# Patient Record
Sex: Female | Born: 1985
Health system: Southern US, Community
[De-identification: ages and names within clinical notes are randomized; demographics above are authoritative.]

## PROBLEM LIST (undated history)

## (undated) DIAGNOSIS — J189 Pneumonia, unspecified organism: Secondary | ICD-10-CM

## (undated) DIAGNOSIS — F840 Autistic disorder: Secondary | ICD-10-CM

## (undated) DIAGNOSIS — F4310 Post-traumatic stress disorder, unspecified: Secondary | ICD-10-CM

## (undated) DIAGNOSIS — F845 Asperger's syndrome: Secondary | ICD-10-CM

## (undated) DIAGNOSIS — K2970 Gastritis, unspecified, without bleeding: Secondary | ICD-10-CM

## (undated) DIAGNOSIS — K279 Peptic ulcer, site unspecified, unspecified as acute or chronic, without hemorrhage or perforation: Secondary | ICD-10-CM

## (undated) DIAGNOSIS — F419 Anxiety disorder, unspecified: Secondary | ICD-10-CM

## (undated) DIAGNOSIS — R51 Headache: Secondary | ICD-10-CM

## (undated) HISTORY — DX: Peptic ulcer, site unspecified, unspecified as acute or chronic, without hemorrhage or perforation: K27.9

## (undated) HISTORY — PX: HAND SURGERY: SHX662

## (undated) HISTORY — DX: Gastritis, unspecified, without bleeding: K29.70

## (undated) HISTORY — DX: Headache: R51

---

## 2010-02-12 ENCOUNTER — Emergency Department (HOSPITAL_COMMUNITY): Admission: EM | Admit: 2010-02-12 | Discharge: 2010-02-12 | Payer: Self-pay | Admitting: Emergency Medicine

## 2010-03-22 ENCOUNTER — Emergency Department (HOSPITAL_COMMUNITY)
Admission: EM | Admit: 2010-03-22 | Discharge: 2010-03-22 | Payer: Self-pay | Source: Home / Self Care | Admitting: Family Medicine

## 2010-04-08 ENCOUNTER — Emergency Department (HOSPITAL_COMMUNITY)
Admission: EM | Admit: 2010-04-08 | Discharge: 2010-04-08 | Payer: Self-pay | Source: Home / Self Care | Admitting: Emergency Medicine

## 2010-04-11 LAB — POCT PREGNANCY, URINE: Preg Test, Ur: NEGATIVE

## 2010-05-21 ENCOUNTER — Emergency Department (HOSPITAL_COMMUNITY)
Admission: EM | Admit: 2010-05-21 | Discharge: 2010-05-21 | Disposition: A | Discharge status: Home / Self Care | Payer: Self-pay | Attending: Emergency Medicine | Admitting: Emergency Medicine

## 2010-05-21 DIAGNOSIS — R079 Chest pain, unspecified: Secondary | ICD-10-CM | POA: Insufficient documentation

## 2010-05-21 DIAGNOSIS — R51 Headache: Secondary | ICD-10-CM | POA: Insufficient documentation

## 2010-05-21 DIAGNOSIS — Z79899 Other long term (current) drug therapy: Secondary | ICD-10-CM | POA: Insufficient documentation

## 2010-05-21 DIAGNOSIS — R55 Syncope and collapse: Secondary | ICD-10-CM | POA: Insufficient documentation

## 2010-05-21 DIAGNOSIS — R110 Nausea: Secondary | ICD-10-CM | POA: Insufficient documentation

## 2010-05-21 DIAGNOSIS — R42 Dizziness and giddiness: Secondary | ICD-10-CM | POA: Insufficient documentation

## 2010-05-21 DIAGNOSIS — J45909 Unspecified asthma, uncomplicated: Secondary | ICD-10-CM | POA: Insufficient documentation

## 2010-05-21 DIAGNOSIS — Z8711 Personal history of peptic ulcer disease: Secondary | ICD-10-CM | POA: Insufficient documentation

## 2010-05-21 LAB — CBC
HCT: 36.1 % (ref 36.0–46.0)
Hemoglobin: 11.8 g/dL — ABNORMAL LOW (ref 12.0–15.0)
MCH: 27.8 pg (ref 26.0–34.0)
MCHC: 32.7 g/dL (ref 30.0–36.0)
MCV: 84.9 fL (ref 78.0–100.0)
Platelets: 251 10*3/uL (ref 150–400)
RBC: 4.25 MIL/uL (ref 3.87–5.11)
RDW: 14 % (ref 11.5–15.5)
WBC: 5.7 10*3/uL (ref 4.0–10.5)

## 2010-05-21 LAB — BASIC METABOLIC PANEL
BUN: 9 mg/dL (ref 6–23)
CO2: 23 mEq/L (ref 19–32)
Calcium: 9.3 mg/dL (ref 8.4–10.5)
Chloride: 106 mEq/L (ref 96–112)
Creatinine, Ser: 0.66 mg/dL (ref 0.4–1.2)
GFR calc Af Amer: >60 mL/min (ref >60)
GFR calc non Af Amer: >60 mL/min (ref >60)
Glucose, Bld: 77 mg/dL (ref 70–99)
Potassium: 3.9 mEq/L (ref 3.5–5.1)
Sodium: 136 mEq/L (ref 135–145)

## 2010-05-21 LAB — DIFFERENTIAL
Basophils Absolute: 0 10*3/uL (ref 0.0–0.1)
Basophils Relative: 1 % (ref 0–1)
Eosinophils Absolute: 0.2 10*3/uL (ref 0.0–0.7)
Eosinophils Relative: 3 % (ref 0–5)
Lymphocytes Relative: 41 % (ref 12–46)
Lymphs Abs: 2.4 10*3/uL (ref 0.7–4.0)
Monocytes Absolute: 0.3 10*3/uL (ref 0.1–1.0)
Monocytes Relative: 5 % (ref 3–12)
Neutro Abs: 2.8 10*3/uL (ref 1.7–7.7)
Neutrophils Relative %: 50 % (ref 43–77)

## 2010-06-07 LAB — URINALYSIS, ROUTINE W REFLEX MICROSCOPIC
Bilirubin Urine: NEGATIVE
Glucose, UA: NEGATIVE mg/dL
Ketones, ur: NEGATIVE mg/dL
Leukocytes, UA: NEGATIVE
Nitrite: NEGATIVE
Protein, ur: NEGATIVE mg/dL
Specific Gravity, Urine: 1.03 (ref 1.005–1.030)
Urobilinogen, UA: 1 mg/dL (ref 0.0–1.0)
pH: 5.5 (ref 5.0–8.0)

## 2010-06-07 LAB — POCT PREGNANCY, URINE: Preg Test, Ur: NEGATIVE

## 2010-06-07 LAB — URINE MICROSCOPIC-ADD ON

## 2010-09-13 ENCOUNTER — Emergency Department (HOSPITAL_COMMUNITY)
Admission: EM | Admit: 2010-09-13 | Discharge: 2010-09-13 | Disposition: A | Discharge status: Home / Self Care | Payer: Self-pay | Attending: Emergency Medicine | Admitting: Emergency Medicine

## 2010-09-13 DIAGNOSIS — H53149 Visual discomfort, unspecified: Secondary | ICD-10-CM | POA: Insufficient documentation

## 2010-09-13 DIAGNOSIS — R110 Nausea: Secondary | ICD-10-CM | POA: Insufficient documentation

## 2010-09-13 DIAGNOSIS — G43909 Migraine, unspecified, not intractable, without status migrainosus: Secondary | ICD-10-CM | POA: Insufficient documentation

## 2010-09-13 DIAGNOSIS — R55 Syncope and collapse: Secondary | ICD-10-CM | POA: Insufficient documentation

## 2010-09-13 LAB — POCT I-STAT, CHEM 8
BUN: 11 mg/dL (ref 6–23)
Calcium, Ion: 1.11 mmol/L — ABNORMAL LOW (ref 1.12–1.32)
Chloride: 108 mEq/L (ref 96–112)
Creatinine, Ser: 0.8 mg/dL (ref 0.50–1.10)
Glucose, Bld: 93 mg/dL (ref 70–99)
HCT: 36 % (ref 36.0–46.0)
Hemoglobin: 12.2 g/dL (ref 12.0–15.0)
Potassium: 3.9 mEq/L (ref 3.5–5.1)
Sodium: 142 mEq/L (ref 135–145)
TCO2: 19 mmol/L (ref 0–100)

## 2010-09-13 LAB — URINALYSIS, ROUTINE W REFLEX MICROSCOPIC
Bilirubin Urine: NEGATIVE
Glucose, UA: NEGATIVE mg/dL
Hgb urine dipstick: NEGATIVE
Ketones, ur: NEGATIVE mg/dL
Leukocytes, UA: NEGATIVE
Nitrite: NEGATIVE
Protein, ur: NEGATIVE mg/dL
Specific Gravity, Urine: 1.031 — ABNORMAL HIGH (ref 1.005–1.030)
Urobilinogen, UA: 1 mg/dL (ref 0.0–1.0)
pH: 6.5 (ref 5.0–8.0)

## 2010-09-13 LAB — POCT PREGNANCY, URINE: Preg Test, Ur: NEGATIVE

## 2010-09-19 ENCOUNTER — Other Ambulatory Visit (HOSPITAL_COMMUNITY)
Admission: RE | Admit: 2010-09-19 | Discharge: 2010-09-19 | Disposition: A | Discharge status: Home / Self Care | Payer: Self-pay | Source: Ambulatory Visit | Attending: Family Medicine | Admitting: Family Medicine

## 2010-09-19 ENCOUNTER — Other Ambulatory Visit: Payer: Self-pay | Admitting: Family Medicine

## 2010-09-19 DIAGNOSIS — Z01419 Encounter for gynecological examination (general) (routine) without abnormal findings: Secondary | ICD-10-CM | POA: Insufficient documentation

## 2010-09-24 ENCOUNTER — Emergency Department (HOSPITAL_COMMUNITY)
Admission: EM | Admit: 2010-09-24 | Discharge: 2010-09-24 | Disposition: A | Discharge status: Home / Self Care | Payer: Worker's Compensation | Attending: Emergency Medicine | Admitting: Emergency Medicine

## 2010-09-24 DIAGNOSIS — E860 Dehydration: Secondary | ICD-10-CM | POA: Insufficient documentation

## 2010-09-24 DIAGNOSIS — R51 Headache: Secondary | ICD-10-CM | POA: Insufficient documentation

## 2010-09-24 LAB — CBC
HCT: 33.8 % — ABNORMAL LOW (ref 36.0–46.0)
Hemoglobin: 11.3 g/dL — ABNORMAL LOW (ref 12.0–15.0)
MCH: 28.2 pg (ref 26.0–34.0)
MCHC: 33.4 g/dL (ref 30.0–36.0)
MCV: 84.3 fL (ref 78.0–100.0)
Platelets: 240 10*3/uL (ref 150–400)
RBC: 4.01 MIL/uL (ref 3.87–5.11)
RDW: 12.9 % (ref 11.5–15.5)
WBC: 5.8 10*3/uL (ref 4.0–10.5)

## 2010-09-24 LAB — BASIC METABOLIC PANEL
BUN: 10 mg/dL (ref 6–23)
CO2: 24 mEq/L (ref 19–32)
Calcium: 8.9 mg/dL (ref 8.4–10.5)
Chloride: 105 mEq/L (ref 96–112)
Creatinine, Ser: 0.59 mg/dL (ref 0.50–1.10)
GFR calc Af Amer: >60 mL/min (ref >60)
GFR calc non Af Amer: >60 mL/min (ref >60)
Glucose, Bld: 114 mg/dL — ABNORMAL HIGH (ref 70–99)
Potassium: 3.2 mEq/L — ABNORMAL LOW (ref 3.5–5.1)
Sodium: 137 mEq/L (ref 135–145)

## 2010-09-24 LAB — DIFFERENTIAL
Basophils Absolute: 0 10*3/uL (ref 0.0–0.1)
Basophils Relative: 1 % (ref 0–1)
Eosinophils Absolute: 0.3 10*3/uL (ref 0.0–0.7)
Eosinophils Relative: 5 % (ref 0–5)
Lymphocytes Relative: 35 % (ref 12–46)
Lymphs Abs: 2 10*3/uL (ref 0.7–4.0)
Monocytes Absolute: 0.4 10*3/uL (ref 0.1–1.0)
Monocytes Relative: 7 % (ref 3–12)
Neutro Abs: 3 10*3/uL (ref 1.7–7.7)
Neutrophils Relative %: 52 % (ref 43–77)

## 2010-09-24 LAB — GLUCOSE, CAPILLARY: Glucose-Capillary: 123 mg/dL — ABNORMAL HIGH (ref 70–99)

## 2011-01-29 ENCOUNTER — Emergency Department (HOSPITAL_COMMUNITY)
Admission: EM | Admit: 2011-01-29 | Discharge: 2011-01-29 | Disposition: A | Discharge status: Home / Self Care | Payer: Self-pay | Attending: Emergency Medicine | Admitting: Emergency Medicine

## 2011-01-29 DIAGNOSIS — R51 Headache: Secondary | ICD-10-CM | POA: Insufficient documentation

## 2011-01-29 LAB — GLUCOSE, CAPILLARY: Glucose-Capillary: 106 mg/dL — ABNORMAL HIGH (ref 70–99)

## 2011-02-20 MED FILL — Diphenhydramine HCl Inj 50 MG/ML: INTRAMUSCULAR | Qty: 1 | Status: AC

## 2011-02-20 MED FILL — Dexamethasone Sodium Phosphate Inj 10 MG/ML: INTRAMUSCULAR | Qty: 1 | Status: AC

## 2011-02-20 MED FILL — Naloxone HCl Inj 1 MG/ML: INTRAMUSCULAR | Qty: 2 | Status: AC

## 2011-02-20 MED FILL — Metoclopramide HCl Inj 5 MG/ML (Base Equivalent): INTRAMUSCULAR | Qty: 2 | Status: AC

## 2011-03-31 ENCOUNTER — Encounter: Payer: Self-pay | Admitting: Cardiovascular Disease

## 2011-03-31 ENCOUNTER — Encounter: Payer: Self-pay | Admitting: *Deleted

## 2011-04-03 ENCOUNTER — Ambulatory Visit (INDEPENDENT_AMBULATORY_CARE_PROVIDER_SITE_OTHER): Payer: Self-pay | Admitting: Cardiovascular Disease

## 2011-04-03 ENCOUNTER — Encounter: Payer: Self-pay | Admitting: Cardiovascular Disease

## 2011-04-03 ENCOUNTER — Ambulatory Visit: Payer: Worker's Compensation | Admitting: Cardiovascular Disease

## 2011-04-03 VITALS — BP 118/60 | HR 71 | Ht 66.0 in | Wt 181.0 lb

## 2011-04-03 DIAGNOSIS — R51 Headache: Secondary | ICD-10-CM

## 2011-04-03 DIAGNOSIS — R42 Dizziness and giddiness: Secondary | ICD-10-CM

## 2011-04-03 DIAGNOSIS — R55 Syncope and collapse: Secondary | ICD-10-CM | POA: Insufficient documentation

## 2011-04-03 DIAGNOSIS — R519 Headache, unspecified: Secondary | ICD-10-CM | POA: Insufficient documentation

## 2011-04-03 NOTE — Assessment & Plan Note (Signed)
Post traumatic closed head injury.  F/U neurology.  Continue Rx for mirgraines as this is the trigger for her "presyncope"

## 2011-04-03 NOTE — Patient Instructions (Signed)
Your physician recommends that you schedule a follow-up appointment in: AS NEEDED  Your physician recommends that you continue on your current medications as directed. Please refer to the Current Medication list given to you today. Your physician has requested that you have a stress echocardiogram. For further information please visit [email or web address] Please follow instruction sheet as given. DX DIZZINESS

## 2011-04-03 NOTE — Assessment & Plan Note (Signed)
No evidence of structural heart disease Normal ECG and exam.  F/U stress echo to make sure there is no structural heart disease.
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
## 2011-04-11 ENCOUNTER — Ambulatory Visit (HOSPITAL_COMMUNITY): Payer: Worker's Compensation | Attending: Cardiovascular Disease | Admitting: Radiology

## 2011-04-11 DIAGNOSIS — R55 Syncope and collapse: Secondary | ICD-10-CM | POA: Insufficient documentation

## 2011-04-11 DIAGNOSIS — R5381 Other malaise: Secondary | ICD-10-CM | POA: Insufficient documentation

## 2011-04-11 DIAGNOSIS — R42 Dizziness and giddiness: Secondary | ICD-10-CM

## 2011-04-11 DIAGNOSIS — Z87891 Personal history of nicotine dependence: Secondary | ICD-10-CM | POA: Insufficient documentation

## 2011-04-11 DIAGNOSIS — J45909 Unspecified asthma, uncomplicated: Secondary | ICD-10-CM | POA: Insufficient documentation

## 2012-07-20 ENCOUNTER — Encounter (HOSPITAL_COMMUNITY): Payer: Self-pay | Admitting: Emergency Medicine

## 2012-07-20 ENCOUNTER — Emergency Department (INDEPENDENT_AMBULATORY_CARE_PROVIDER_SITE_OTHER)
Admission: EM | Admit: 2012-07-20 | Discharge: 2012-07-20 | Disposition: A | Discharge status: Nursing Home (Includes ICF) | Payer: Self-pay | Source: Home / Self Care | Attending: Family Medicine | Admitting: Family Medicine

## 2012-07-20 ENCOUNTER — Emergency Department (HOSPITAL_COMMUNITY)
Admission: EM | Admit: 2012-07-20 | Discharge: 2012-07-21 | Disposition: A | Discharge status: Other Acute Inpatient Hospital | Payer: Self-pay | Attending: Emergency Medicine | Admitting: Emergency Medicine

## 2012-07-20 DIAGNOSIS — Z8711 Personal history of peptic ulcer disease: Secondary | ICD-10-CM | POA: Insufficient documentation

## 2012-07-20 DIAGNOSIS — G8921 Chronic pain due to trauma: Secondary | ICD-10-CM | POA: Insufficient documentation

## 2012-07-20 DIAGNOSIS — R45851 Suicidal ideations: Secondary | ICD-10-CM

## 2012-07-20 DIAGNOSIS — Z8719 Personal history of other diseases of the digestive system: Secondary | ICD-10-CM | POA: Insufficient documentation

## 2012-07-20 DIAGNOSIS — F3289 Other specified depressive episodes: Secondary | ICD-10-CM

## 2012-07-20 DIAGNOSIS — R51 Headache: Secondary | ICD-10-CM | POA: Insufficient documentation

## 2012-07-20 DIAGNOSIS — F17200 Nicotine dependence, unspecified, uncomplicated: Secondary | ICD-10-CM | POA: Insufficient documentation

## 2012-07-20 DIAGNOSIS — F329 Major depressive disorder, single episode, unspecified: Secondary | ICD-10-CM

## 2012-07-20 DIAGNOSIS — J45909 Unspecified asthma, uncomplicated: Secondary | ICD-10-CM | POA: Insufficient documentation

## 2012-07-20 DIAGNOSIS — Z79899 Other long term (current) drug therapy: Secondary | ICD-10-CM | POA: Insufficient documentation

## 2012-07-20 DIAGNOSIS — Z3202 Encounter for pregnancy test, result negative: Secondary | ICD-10-CM | POA: Insufficient documentation

## 2012-07-20 DIAGNOSIS — Z87828 Personal history of other (healed) physical injury and trauma: Secondary | ICD-10-CM | POA: Insufficient documentation

## 2012-07-20 DIAGNOSIS — G894 Chronic pain syndrome: Secondary | ICD-10-CM

## 2012-07-20 LAB — ETHANOL: Alcohol, Ethyl (B): <11 mg/dL (ref 0–11)

## 2012-07-20 LAB — CBC
HCT: 37.5 % (ref 36.0–46.0)
MCV: 83.7 fL (ref 78.0–100.0)
RBC: 4.48 MIL/uL (ref 3.87–5.11)
WBC: 5.7 10*3/uL (ref 4.0–10.5)

## 2012-07-20 LAB — SALICYLATE LEVEL: Salicylate Lvl: <2 mg/dL — ABNORMAL LOW (ref 2.8–20.0)

## 2012-07-20 LAB — COMPREHENSIVE METABOLIC PANEL
AST: 18 U/L (ref 0–37)
BUN: 9 mg/dL (ref 6–23)
CO2: 26 mEq/L (ref 19–32)
Chloride: 105 mEq/L (ref 96–112)
Creatinine, Ser: 0.6 mg/dL (ref 0.50–1.10)
GFR calc non Af Amer: >90 mL/min (ref >90)
Total Bilirubin: 0.3 mg/dL (ref 0.3–1.2)

## 2012-07-20 LAB — RAPID URINE DRUG SCREEN, HOSP PERFORMED
Amphetamines: NOT DETECTED
Barbiturates: NOT DETECTED

## 2012-07-20 LAB — POCT PREGNANCY, URINE: Preg Test, Ur: NEGATIVE

## 2012-07-20 MED ORDER — ACETAMINOPHEN-CODEINE #3 300-30 MG PO TABS: 2.0000 | ORAL_TABLET | ORAL | Status: DC | PRN

## 2012-07-20 MED ORDER — SUMATRIPTAN SUCCINATE 50 MG PO TABS: 50.0000 mg | ORAL_TABLET | ORAL | Status: DC | PRN

## 2012-07-20 MED ORDER — PROMETHAZINE HCL 25 MG PO TABS: 25.0000 mg | ORAL_TABLET | Freq: Four times a day (QID) | ORAL | Status: DC | PRN

## 2012-07-20 MED ORDER — CITALOPRAM HYDROBROMIDE 10 MG PO TABS
20.0000 mg | ORAL_TABLET | Freq: Every day | ORAL | Status: DC
  Administered 2012-07-20: 20 mg via ORAL
  Filled 2012-07-20: qty 2

## 2012-07-20 MED ORDER — ACETAMINOPHEN 325 MG PO TABS: 650.0000 mg | ORAL_TABLET | ORAL | Status: DC | PRN

## 2012-07-20 MED ORDER — NABUMETONE 500 MG PO TABS
500.0000 mg | ORAL_TABLET | Freq: Two times a day (BID) | ORAL | Status: DC
  Administered 2012-07-20: 500 mg via ORAL
  Filled 2012-07-20: qty 1

## 2012-07-20 MED ORDER — DIVALPROEX SODIUM 250 MG PO DR TAB
250.0000 mg | DELAYED_RELEASE_TABLET | Freq: Two times a day (BID) | ORAL | Status: DC
  Administered 2012-07-20: 250 mg via ORAL
  Filled 2012-07-20: qty 1

## 2012-07-20 MED ORDER — ZOLPIDEM TARTRATE 5 MG PO TABS: 5.0000 mg | ORAL_TABLET | Freq: Every evening | ORAL | Status: DC | PRN

## 2012-07-20 MED ORDER — ONDANSETRON HCL 8 MG PO TABS: 4.0000 mg | ORAL_TABLET | Freq: Three times a day (TID) | ORAL | Status: DC | PRN

## 2012-07-20 MED ORDER — ALUM & MAG HYDROXIDE-SIMETH 200-200-20 MG/5ML PO SUSP: 30.0000 mL | ORAL | Status: DC | PRN

## 2012-07-20 MED ORDER — IBUPROFEN 200 MG PO TABS: 600.0000 mg | ORAL_TABLET | Freq: Three times a day (TID) | ORAL | Status: DC | PRN

## 2012-07-20 NOTE — ED Notes (Signed)
Pt received meal tray. 

## 2012-07-20 NOTE — ED Notes (Signed)
Pt. Stated, I've been having headaches and started again yesterday and I'm in pain all the time and if somebody doesn't do something I'm just going to jump off a bridge.

## 2012-07-20 NOTE — ED Notes (Signed)
Pt's purse and clothing sent home with mother.

## 2012-07-20 NOTE — ED Provider Notes (Signed)
History     CSN: [id number]  Arrival date & time 07/20/12  1205   First MD Initiated Contact with Patient 07/20/12 1216      Chief Complaint  Patient presents with  . Migraine    constant migraines. sensitivity to light. constanst nausea. tremors. anxiety.   . Depression    pt states sick of being in pain. pt planned to kill herself last night.     (Consider location/radiation/quality/duration/timing/severity/associated sxs/prior treatment) Patient is a 27 y.o. female presenting with migraines. The history is provided by the patient and a parent.  Migraine This is a chronic (onset after head injury 2 yr ago at work, now daily with any activities of daily living. has seen neurologist without help., was suicidal last eve, mother tlked with her to relieve acute situation,  brings her today for help.) problem. The current episode started more than 1 week ago. The problem has been gradually worsening. Relieved by: smoking pot seems to give temp relief.    Past Medical History  Diagnosis Date  . Headache   . Asthma   . Gastritis   . PUD (peptic ulcer disease)     History reviewed. No pertinent past surgical history.  History reviewed. No pertinent family history.  History  Substance Use Topics  . Smoking status: Current Some Day Smoker    Types: Cigarettes    Last Attempt to Quit: 03/08/2011  . Smokeless tobacco: Never Used  . Alcohol Use: No    OB History   Grav Para Term Preterm Abortions TAB SAB Ect Mult Living                  Review of Systems  Constitutional: Positive for fatigue.  Psychiatric/Behavioral: Positive for suicidal ideas, behavioral problems, dysphoric mood and agitation.    Allergies  Tramadol  Home Medications   Current Outpatient Rx  Name  Route  Sig  Dispense  Refill  . acetaminophen-codeine (TYLENOL #3) 300-30 MG per tablet   Oral   Take 1 tablet by mouth every 4 (four) hours as needed.           . divalproex (DEPAKOTE) 250 MG DR  tablet   Oral   Take 250 mg by mouth 2 (two) times daily.           . frovatriptan (FROVA) 2.5 MG tablet   Oral   Take 2.5 mg by mouth as needed. If recurs, may repeat after 2 hours. Max of 3 tabs in 24 hours.          . nabumetone (RELAFEN) 500 MG tablet   Oral   Take 500 mg by mouth 2 (two) times daily.           . promethazine (PHENERGAN) 25 MG tablet   Oral   Take 25 mg by mouth every 6 (six) hours as needed.             BP 150/85  Pulse 67  Temp(Src) 99.1 F (37.3 C) (Oral)  Resp 21  SpO2 100%  LMP 07/17/2012  Physical Exam  Nursing note and vitals reviewed. Constitutional: She is oriented to person, place, and time. She appears well-developed and well-nourished. She appears distressed.  Neurological: She is alert and oriented to person, place, and time.  Skin: Skin is warm and dry.  Psychiatric: Her speech is normal. Judgment and thought content normal. Her mood appears anxious. Her affect is angry and labile. She is agitated. Cognition and memory are normal. She exhibits  a depressed mood.    ED Course  Procedures (including critical care time)  Labs Reviewed - No data to display No results found.   1. Depression with suicidal ideation   2. Chronic pain associated with significant psychosocial dysfunction       MDM  Sent for acute suicidal ideations with plan, secondary to chronic daily headaches from head injury otj 2 yrs ago,         Linna Hoff, MD 07/20/12 1437

## 2012-07-20 NOTE — ED Notes (Signed)
Pt. Was wanded by security  

## 2012-07-20 NOTE — ED Notes (Signed)
Mom with pt.
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
## 2012-07-20 NOTE — ED Provider Notes (Signed)
History     CSN: [id number]  Arrival date & time 07/20/12  1443   First MD Initiated Contact with Patient 07/20/12 1527      Chief Complaint  Patient presents with  . Headache  . Suicidal    (Consider location/radiation/quality/duration/timing/severity/associated sxs/prior treatment) HPI Comments: A 27-year-old female with a history of post rheumatic head aches who presents with a complaint of depression and suicidal thoughts. The patient was diagnosed with situational depression when she was in college, at that time she had had suicidal thoughts and an attempt by overdose but since that time has done well. 2 years ago she suffered a head injury at work and since that time has had chronic headaches Job been debilitating for, has had increased difficulty with the functioning in her environment has been unable to hold a job, unable to communicate with family members effectively and has difficulty with understanding simple commands. The patient has had increased suicidal thoughts over the last 24 hours and though she states she does not have a plan to me she has talked about jumping off a bridge. She denies any self injury in the last 2 days, gradually worsening, headaches make it worse.  Patient is a 27 y.o. female presenting with headaches. The history is provided by the patient.  Headache   Past Medical History  Diagnosis Date  . Headache   . Asthma   . Gastritis   . PUD (peptic ulcer disease)     History reviewed. No pertinent past surgical history.  No family history on file.  History  Substance Use Topics  . Smoking status: Current Some Day Smoker    Types: Cigarettes    Last Attempt to Quit: 03/08/2011  . Smokeless tobacco: Never Used  . Alcohol Use: No    OB History   Grav Para Term Preterm Abortions TAB SAB Ect Mult Living                  Review of Systems  Neurological: Positive for headaches.  All other systems reviewed and are negative.    Allergies   Tramadol  Home Medications   Current Outpatient Rx  Name  Route  Sig  Dispense  Refill  . acetaminophen-codeine (TYLENOL #3) 300-30 MG per tablet   Oral   Take 1 tablet by mouth every 4 (four) hours as needed.           . divalproex (DEPAKOTE) 250 MG DR tablet   Oral   Take 250 mg by mouth 2 (two) times daily.           . frovatriptan (FROVA) 2.5 MG tablet   Oral   Take 2.5 mg by mouth as needed. If recurs, may repeat after 2 hours. Max of 3 tabs in 24 hours.          . nabumetone (RELAFEN) 500 MG tablet   Oral   Take 500 mg by mouth 2 (two) times daily.           . promethazine (PHENERGAN) 25 MG tablet   Oral   Take 25 mg by mouth every 6 (six) hours as needed.             BP 138/90  Pulse 68  Temp(Src) 98.8 F (37.1 C) (Oral)  Resp 18  SpO2 100%  LMP 07/17/2012  Physical Exam  Nursing note and vitals reviewed. Constitutional: She appears well-developed and well-nourished. No distress.  HENT:  Head: Normocephalic and atraumatic.  Mouth/Throat: Oropharynx is  clear and moist. No oropharyngeal exudate.  Eyes: Conjunctivae and EOM are normal. Pupils are equal, round, and reactive to light. Right eye exhibits no discharge. Left eye exhibits no discharge. No scleral icterus.  Neck: Normal range of motion. Neck supple. No JVD present. No thyromegaly present.  Cardiovascular: Normal rate, regular rhythm, normal heart sounds and intact distal pulses.  Exam reveals no gallop and no friction rub.   No murmur heard. Pulmonary/Chest: Effort normal and breath sounds normal. No respiratory distress. She has no wheezes. She has no rales.  Abdominal: Soft. Bowel sounds are normal. She exhibits no distension and no mass. There is no tenderness.  Musculoskeletal: Normal range of motion. She exhibits no edema and no tenderness.  Lymphadenopathy:    She has no cervical adenopathy.  Neurological: She is alert. Coordination normal.  Skin: Skin is warm and dry. No rash noted.  No erythema.  Psychiatric:  Tearful, distraught, emotionally labile, no hallucinations, not responding to internal stimuli    ED Course  Procedures (including critical care time)  Labs Reviewed  ACETAMINOPHEN LEVEL  CBC  COMPREHENSIVE METABOLIC PANEL  ETHANOL  SALICYLATE LEVEL  URINE RAPID DRUG SCREEN (HOSP PERFORMED)   No results found.   No diagnosis found.    MDM  The patient appears to have psychiatric decompensation, she will require psychiatry evaluation as the patient at this time is not safe to be by herself. Her mother presents with her and states that she is very concerned about her as well.  Discussed with psychiatrist Dr. Rob Bunting who agrees that the patient should be admitted for her escalating suicidality and has recommended Celexa daily.  Behavioral health assessment team consult requested      Vida Roller, MD 07/20/12 1840

## 2012-07-20 NOTE — BHH Counselor (Signed)
Writer consulted with the ERMD regarding patient.  Writer informed the ERMD that the incoming ACT person has been given a shift report over the phone and he is aware of the patients.  The incoming ACT person will be in after 8pm.

## 2012-07-20 NOTE — BHH Counselor (Signed)
[name], assessment counselor at [facility], submitted Pt for admission to [facility]. [name], [facility] confirmed bed availability. Gave clinical report Lori Guild, PA who accepted Pt to the service of Dr. Henrietta Rowe, 501-2. Notified [name] of acceptance.  [name], LPC, [facility] Assessment Counselor
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
## 2012-07-20 NOTE — ED Notes (Signed)
House coverage called to provide a sitter.

## 2012-07-21 ENCOUNTER — Encounter (HOSPITAL_COMMUNITY): Payer: Self-pay | Admitting: *Deleted

## 2012-07-21 ENCOUNTER — Inpatient Hospital Stay (HOSPITAL_COMMUNITY)
Admission: AD | Admit: 2012-07-21 | Discharge: 2012-07-25 | DRG: 885 | Disposition: A | Discharge status: Home / Self Care | Payer: No Typology Code available for payment source | Source: Intra-hospital | Attending: Psychiatry | Admitting: Psychiatry

## 2012-07-21 DIAGNOSIS — F329 Major depressive disorder, single episode, unspecified: Secondary | ICD-10-CM

## 2012-07-21 DIAGNOSIS — J45909 Unspecified asthma, uncomplicated: Secondary | ICD-10-CM | POA: Diagnosis present

## 2012-07-21 DIAGNOSIS — F39 Unspecified mood [affective] disorder: Secondary | ICD-10-CM

## 2012-07-21 DIAGNOSIS — R45851 Suicidal ideations: Secondary | ICD-10-CM

## 2012-07-21 DIAGNOSIS — F411 Generalized anxiety disorder: Secondary | ICD-10-CM

## 2012-07-21 DIAGNOSIS — F3289 Other specified depressive episodes: Secondary | ICD-10-CM

## 2012-07-21 DIAGNOSIS — R51 Headache: Secondary | ICD-10-CM | POA: Diagnosis present

## 2012-07-21 DIAGNOSIS — F331 Major depressive disorder, recurrent, moderate: Principal | ICD-10-CM | POA: Diagnosis present

## 2012-07-21 DIAGNOSIS — Z79899 Other long term (current) drug therapy: Secondary | ICD-10-CM

## 2012-07-21 MED ORDER — SUMATRIPTAN SUCCINATE 50 MG PO TABS
50.0000 mg | ORAL_TABLET | ORAL | Status: DC | PRN
  Administered 2012-07-21 – 2012-07-25 (×5): 50 mg via ORAL
  Filled 2012-07-21: qty 1
  Filled 2012-07-21: qty 4
  Filled 2012-07-21 (×5): qty 1

## 2012-07-21 MED ORDER — ONDANSETRON HCL 4 MG PO TABS
4.0000 mg | ORAL_TABLET | Freq: Three times a day (TID) | ORAL | Status: DC | PRN
  Administered 2012-07-23: 4 mg via ORAL

## 2012-07-21 MED ORDER — CITALOPRAM HYDROBROMIDE 20 MG PO TABS
20.0000 mg | ORAL_TABLET | Freq: Every day | ORAL | Status: DC
  Administered 2012-07-21: 20 mg via ORAL
  Filled 2012-07-21 (×2): qty 1

## 2012-07-21 MED ORDER — ALUM & MAG HYDROXIDE-SIMETH 200-200-20 MG/5ML PO SUSP
30.0000 mL | ORAL | Status: DC | PRN
  Administered 2012-07-24 – 2012-07-25 (×2): 30 mL via ORAL

## 2012-07-21 MED ORDER — MAGNESIUM HYDROXIDE 400 MG/5ML PO SUSP: 30.0000 mL | Freq: Every day | ORAL | Status: DC | PRN

## 2012-07-21 MED ORDER — TRAZODONE HCL 50 MG PO TABS
50.0000 mg | ORAL_TABLET | Freq: Every evening | ORAL | Status: DC | PRN
  Administered 2012-07-21: 50 mg via ORAL
  Filled 2012-07-21: qty 1

## 2012-07-21 MED ORDER — DIPHENHYDRAMINE HCL 25 MG PO CAPS
50.0000 mg | ORAL_CAPSULE | Freq: Every evening | ORAL | Status: DC | PRN
  Administered 2012-07-21 – 2012-07-24 (×5): 50 mg via ORAL
  Filled 2012-07-21 (×3): qty 2

## 2012-07-21 MED ORDER — ACETAMINOPHEN 325 MG PO TABS
650.0000 mg | ORAL_TABLET | Freq: Four times a day (QID) | ORAL | Status: DC | PRN
  Administered 2012-07-21: 650 mg via ORAL

## 2012-07-21 MED ORDER — TOPIRAMATE 25 MG PO TABS
50.0000 mg | ORAL_TABLET | Freq: Once | ORAL | Status: AC
  Administered 2012-07-21: 50 mg via ORAL
  Filled 2012-07-21: qty 1
  Filled 2012-07-21: qty 2
  Filled 2012-07-21: qty 1

## 2012-07-21 MED ORDER — DIVALPROEX SODIUM ER 250 MG PO TB24
250.0000 mg | ORAL_TABLET | Freq: Two times a day (BID) | ORAL | Status: DC
  Administered 2012-07-21 – 2012-07-25 (×9): 250 mg via ORAL
  Filled 2012-07-21: qty 1
  Filled 2012-07-21: qty 28
  Filled 2012-07-21: qty 1
  Filled 2012-07-21: qty 28
  Filled 2012-07-21 (×9): qty 1

## 2012-07-21 MED ORDER — AMITRIPTYLINE HCL 25 MG PO TABS
25.0000 mg | ORAL_TABLET | Freq: Every day | ORAL | Status: DC
  Filled 2012-07-21 (×2): qty 1

## 2012-07-21 MED ORDER — NABUMETONE 500 MG PO TABS
500.0000 mg | ORAL_TABLET | Freq: Two times a day (BID) | ORAL | Status: DC
  Administered 2012-07-21 – 2012-07-25 (×9): 500 mg via ORAL
  Filled 2012-07-21 (×3): qty 1
  Filled 2012-07-21: qty 28
  Filled 2012-07-21 (×3): qty 1
  Filled 2012-07-21: qty 28
  Filled 2012-07-21 (×5): qty 1

## 2012-07-21 NOTE — Progress Notes (Signed)
Pt is a 27 year old AAF admitted to the services of Dr. Daleen Bo for suicidal ideation due to chronic migraine headaches.  Pt had a severe migraine with extreme nausea during admission process and could not answer all questions.  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  She states that she has had chronic migraines since that time, Pt states "ma'am, my head hurts every day".  Pt states that this has just weighed her down emotionally and that she has been unable to work  Pt also has depression due to "family issues" that Pt did not want to go into due to her migraine.  Pt requested something for nausea, pain, and sleep.  She states that at home she takes Tylenol PM and that sleep is about the only thing that helps.  Pt states that she has asthma and that she just tries to stay away from anything that triggers that.  Pt is cooperative with the admission process.  She lists her mother Pam Drown as her emergency contact [phone].

## 2012-07-21 NOTE — Progress Notes (Signed)
Goals  Group Note  Date: 07/21/2012 Time: 1015  Group Topic/Focus:  Making Healthy Choices:   The focus of this group is to help patients identify negative/unhealthy choices they were using prior to admission and identify positive/healthier coping strategies to replace them upon discharge.  Participation Level:  Active  Participation Quality:  Appropriate  Affect:  Appropriate  Cognitive:  Alert and Appropriate  Insight:  Engaged  Engagement in Group:  Engaged  Additional Comments:    07/21/2012,12:48 PM Ellis Mehaffey, Joie Bimler

## 2012-07-21 NOTE — Progress Notes (Signed)
BHH Group Notes:  (Nursing/MHT/Case Management/Adjunct)  Date:  07/21/2012  Time:  2000 Type of Therapy:  Psychoeducational Skills  Participation Level:  Minimal  Participation Quality:  Resistant  Affect:  Blunted  Cognitive:  Appropriate  Insight:  Good  Engagement in Group:  Lacking  Modes of Intervention:  Education  Summary of Progress/Problems: The patient mentioned in group that she felt "irritated" for much of the day. She felt irritated due to the noise and activity coming from the dayroom. She was unable to state a goal for tomorrow.   Hazle Coca S 07/21/2012, 9:40 PM

## 2012-07-21 NOTE — Progress Notes (Signed)
Goals Group Note  Date: 07/21/2012 Time:  0915  Group Topic/Focus:  Making Healthy Choices:   The focus of this group is to help patients identify negative/unhealthy choices they were using prior to admission and identify positive/healthier coping strategies to replace them upon discharge.  Participation Level:  Active  Participation Quality:  Appropriate  Affect:  Appropriate  Cognitive:  Appropriate  Insight:  Engaged  Engagement in Group:  Engaged  Additional Comments:    07/21/2012,12:49 PM Demetrius Mahler, Joie Bimler

## 2012-07-21 NOTE — Progress Notes (Signed)
[name] has spent most of her day in her bed..in her room. She says " all the noise" really " gets on my nerves" and has chosen to isolate herself in her room, where she says " that's where I feel better". She did not complete her AM self inventory but does contract for safety with this Clinical research associate, denying she is suicidal at this point.    A She asks appropriate questions about her POC and her meds .    R Safety is in place and POC moves forward with therapeutic relationship fostered.

## 2012-07-21 NOTE — Progress Notes (Signed)
D.  Pt pleasant on approach, does report a headache and would like medication for that as well as insomnia.  Positive for evening group, see group notes.  Interacting appropriately within milieu. Denies SI/HI/hallucinatons at this time.  A.  Medication given as ordered for headache and insomnia.  Support and encouragement offered  R.  Pt remains safe on unit, will continue to monitor.

## 2012-07-21 NOTE — Progress Notes (Signed)
.   07/21/2012 Psychoeducational Group Note  Date:  07/21/2012 Time: 1015 Group Topic/Focus:  Making Healthy Choices:   The focus of this group is to help patients identify negative/unhealthy choices they were using prior to admission and identify positive/healthier coping strategies to replace them upon discharge.  Participation Level:  Did Not Attend   Additional Comments:   Rich Brave 1:54 PM. 07/21/2012
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
## 2012-07-21 NOTE — Tx Team (Signed)
Initial Interdisciplinary Treatment Plan  PATIENT STRENGTHS: (choose at least two) Ability for insight Average or above average intelligence Capable of independent living Supportive family/friends Work skills  PATIENT STRESSORS: Health problems Occupational concerns Traumatic event   PROBLEM LIST: Problem List/Patient Goals Date to be addressed Date deferred Reason deferred Estimated date of resolution  Suicidal Ideation 07/21/12     Depression  07/21/12     Chronic migraines 07/21/12                                          DISCHARGE CRITERIA:  Improved stabilization in mood, thinking, and/or behavior Need for constant or close observation no longer present Verbal commitment to aftercare and medication compliance  PRELIMINARY DISCHARGE PLAN: Return to previous living arrangement Return to previous work or school arrangements  PATIENT/FAMIILY INVOLVEMENT: This treatment plan has been presented to and reviewed with the patient, [name].  The patient and family have been given the opportunity to ask questions and make suggestions.  Juliann Pares 07/21/2012, 1:50 AM

## 2012-07-21 NOTE — [facility] Suicide Risk Assessment (Signed)
Suicide Risk Assessment  Admission Assessment     Nursing information obtained from:  Patient Demographic factors:  Adolescent or young adult;Gay, lesbian, or bisexual orientation Current Mental Status:  Suicidal ideation indicated by patient Loss Factors:  Decline in physical health Historical Factors:  NA (unable to access, Pt with severe migraine/nausea) Risk Reduction Factors:  Sense of responsibility to family;Employed;Living with another person, especially a relative;Positive social support  CLINICAL FACTORS:   Depression:   Hopelessness More than one psychiatric diagnosis Medical Diagnoses and Treatments/Surgeries  COGNITIVE FEATURES THAT CONTRIBUTE TO RISK:  Closed-mindedness Thought constriction (tunnel vision)    SUICIDE RISK:  Moderate:  Frequent suicidal ideation with limited intensity, and duration, some specificity in terms of plans, no associated intent, good self-control, limited dysphoria/symptomatology, some risk factors present, and identifiable protective factors, including available and accessible social support.  PLAN OF CARE: We will admit [name] for purposes of safety and stabilization. She will attend group therapy sessions to gain insight and learn healthy coping strategies. We will initiate pharmacological therapies to target her mood as well as attempt to relieve her pain issues. She will require referrals for medication management and counseling therapy. Observation Level/Precautions: 15 minute checks  Laboratory: Per emergency department  Psychotherapy: Attend groups  Medications: Continue Depakote from the ED, Imitrex. Will not prescribe elavil, given a history of overdose and overuse of medications. Consultations:  Discharge Concerns: Risk for suicidal gestures  Estimated LOS: 5-7 days  Other:   I certify that inpatient services furnished can reasonably be expected to improve the patient's condition.  [name] 07/21/2012, 3:06 PM

## 2012-07-21 NOTE — [facility] Group Notes (Signed)
[facility] Group Notes:  (Clinical Social Work)  07/21/2012   3:00-4:00PM  Summary of Progress/Problems:   The main focus of today's process group was to   identify the patient's current support system and decide on other supports that can be put in place.  Four definitions/levels of support were discussed and an exercise was utilized to show how much stronger we become with additional supports.  An emphasis was placed on using counselor, doctor, therapy groups, 12-step groups, and problem-specific support groups to expand supports, as well as doing something different than has been done before. The patient expressed that she had a headache and became upset by a soft jazz song played at the beginning of group to get focused, said she would leave unless that stopped.  CSW did stop the music and she stayed, but CSW suggested that she go lie down which she did.  Type of Therapy:  Process Group  Participation Level:  None  Participation Quality:  Resistant, Irritable  Affect:  Flat, Depressed  Cognitive:  Oriented  Insight:  Limited  Engagement in Therapy:  Limited  Modes of Intervention:  Education,  Support and Processing, Exploration, Discussion   Ambrose Mantle, LCSW 07/21/2012, 4:52 PM

## 2012-07-22 DIAGNOSIS — F332 Major depressive disorder, recurrent severe without psychotic features: Secondary | ICD-10-CM

## 2012-07-22 MED ORDER — SERTRALINE HCL 25 MG PO TABS
25.0000 mg | ORAL_TABLET | Freq: Every day | ORAL | Status: DC
  Administered 2012-07-22 – 2012-07-25 (×4): 25 mg via ORAL
  Filled 2012-07-22 (×4): qty 1
  Filled 2012-07-22: qty 14
  Filled 2012-07-22 (×2): qty 1

## 2012-07-22 NOTE — [facility] Group Notes (Signed)
[facility] LCSW Group Therapy        Overcoming Obstacles 1:15 2:30 PM         07/22/2012 3:07 PM  Type of Therapy:  Group Therapy  Participation Level:  Minimal  Participation Quality:  Inattentive  Affect:  Depressed and Flat  Cognitive:  Appropriate  Insight:  Limited  Engagement in Therapy:  Lacking  Modes of Intervention:  Discussion, Education, Exploration, Problem-Solving, Rapport Building, Support  Summary of Progress/Problems:  Patient advised of having difficulty participating in group due to a migraine headache.  [name] 07/22/2012, 3:07 PM

## 2012-07-22 NOTE — [facility] Group Notes (Signed)
[facility] LCSW Aftercare Discharge Planning Group Note   07/22/2012 3:57 PM  Participation Quality:  Appropriate  Mood/Affect:  Depressed and Flat  Depression Rating:  8  Anxiety Rating:  6  Thoughts of Suicide:  Yes  Will you contract for safety?   Yes  Current AVH:  No  Plan for Discharge/Comments:  Patient advised of admitting to hospital with depression, SI anger and aggression.  She endorses severe headaches.  She has home and transportation but will need assistance with medications and referral for outpatient follow up.  Transportation Means: Patient uses public transportation.   Supports:  Patient has a good support system.   [name], Joesph July

## 2012-07-22 NOTE — Progress Notes (Signed)
[facility] MD Progress Note  07/22/2012 1:07 PM [name]  MRN:  [id number] Subjective:  Patient reports feeling depressed. Continues to have headaches. Continues to be concerned about her family conflict.   Diagnosis:   Axis I: Major Depression, Recurrent severe Axis II: Deferred Axis III:  Past Medical History  Diagnosis Date  . Headache   . Asthma   . Gastritis   . PUD (peptic ulcer disease)    Axis IV: occupational problems and other psychosocial or environmental problems Axis V: 41-50 serious symptoms  ADL's:  Intact  Sleep: Fair  Appetite:  Fair   Psychiatric Specialty Exam: Review of Systems  Constitutional: Negative.   Eyes: Negative.   Respiratory: Negative.   Cardiovascular: Negative.   Gastrointestinal: Negative.   Genitourinary: Negative.   Musculoskeletal: Negative.   Skin: Negative.   Neurological: Positive for headaches.  Endo/Heme/Allergies: Negative.   Psychiatric/Behavioral: Positive for depression and suicidal ideas. The patient is nervous/anxious.     Blood pressure 124/89, pulse 101, temperature 98.9 F (37.2 C), temperature source Oral, resp. rate 18, last menstrual period 07/17/2012.There is no weight on file to calculate BMI.  General Appearance: Casual  Eye Contact::  Fair  Speech:  Clear and Coherent  Volume:  Decreased  Mood:  Anxious and Depressed  Affect:  Constricted and Depressed  Thought Process:  Coherent  Orientation:  Full (Time, Place, and Person)  Thought Content:  Rumination  Suicidal Thoughts:  No  Homicidal Thoughts:  No  Memory:  Immediate;   Fair Recent;   Fair Remote;   Fair  Judgement:  Fair  Insight:  Fair  Psychomotor Activity:  Normal  Concentration:  Fair  Recall:  Fair  Akathisia:  No  Handed:  Right  AIMS (if indicated):     Assets:  Communication Skills Desire for Improvement Housing Social Support  Sleep:  Number of Hours: 6.75   Current Medications: Current Facility-Administered Medications   Medication Dose Route Frequency Provider Last Rate Last Dose  . acetaminophen (TYLENOL) tablet 650 mg  650 mg Oral Q6H PRN Jorje Guild, PA-C   650 mg at 07/21/12 2144  . alum & mag hydroxide-simeth (MAALOX/MYLANTA) 200-200-20 MG/5ML suspension 30 mL  30 mL Oral Q4H PRN Jorje Guild, PA-C      . diphenhydrAMINE (BENADRYL) capsule 50 mg  50 mg Oral QHS PRN,MR X 1 Larena Sox, MD   50 mg at 07/21/12 2144  . divalproex (DEPAKOTE ER) 24 hr tablet 250 mg  250 mg Oral BID Jorje Guild, PA-C   250 mg at 07/22/12 0753  . magnesium hydroxide (MILK OF MAGNESIA) suspension 30 mL  30 mL Oral Daily PRN Jorje Guild, PA-C      . nabumetone (RELAFEN) tablet 500 mg  500 mg Oral BID Jorje Guild, PA-C   500 mg at 07/22/12 0754  . ondansetron (ZOFRAN) tablet 4 mg  4 mg Oral Q8H PRN Jorje Guild, PA-C      . sertraline (ZOLOFT) tablet 25 mg  25 mg Oral Daily Reeshemah Nazaryan, MD      . SUMAtriptan (IMITREX) tablet 50 mg  50 mg Oral Q2H PRN Jorje Guild, PA-C   50 mg at 07/21/12 2144    Lab Results:  Results for orders placed during the hospital encounter of 07/20/12 (from the past 48 hour(s))  ACETAMINOPHEN LEVEL     Status: None   Collection Time    07/20/12  3:39 PM      Result Value Range   Acetaminophen (Tylenol),  Serum <15.0  10 - 30 ug/mL   Comment:            THERAPEUTIC CONCENTRATIONS VARY     SIGNIFICANTLY. A RANGE OF 10-30     ug/mL MAY BE AN EFFECTIVE     CONCENTRATION FOR MANY PATIENTS.     HOWEVER, SOME ARE BEST TREATED     AT CONCENTRATIONS OUTSIDE THIS     RANGE.     ACETAMINOPHEN CONCENTRATIONS     >150 ug/mL AT 4 HOURS AFTER     INGESTION AND >50 ug/mL AT 12     HOURS AFTER INGESTION ARE     OFTEN ASSOCIATED WITH TOXIC     REACTIONS.  CBC     Status: None   Collection Time    07/20/12  3:39 PM      Result Value Range   WBC 5.7  4.0 - 10.5 K/uL   RBC 4.48  3.87 - 5.11 MIL/uL   Hemoglobin 12.6  12.0 - 15.0 g/dL   HCT 16.1  09.6 - 04.5 %   MCV 83.7  78.0 - 100.0 fL   MCH 28.1  26.0 - 34.0 pg    MCHC 33.6  30.0 - 36.0 g/dL   RDW 40.9  81.1 - 91.4 %   Platelets 301  150 - 400 K/uL  COMPREHENSIVE METABOLIC PANEL     Status: None   Collection Time    07/20/12  3:39 PM      Result Value Range   Sodium 141  135 - 145 mEq/L   Potassium 3.6  3.5 - 5.1 mEq/L   Chloride 105  96 - 112 mEq/L   CO2 26  19 - 32 mEq/L   Glucose, Bld 88  70 - 99 mg/dL   BUN 9  6 - 23 mg/dL   Creatinine, Ser 7.82  0.50 - 1.10 mg/dL   Calcium 9.9  8.4 - 95.6 mg/dL   Total Protein 8.2  6.0 - 8.3 g/dL   Albumin 4.5  3.5 - 5.2 g/dL   AST 18  0 - 37 U/L   ALT 10  0 - 35 U/L   Alkaline Phosphatase 57  39 - 117 U/L   Total Bilirubin 0.3  0.3 - 1.2 mg/dL   GFR calc non Af Amer >90  >90 mL/min   GFR calc Af Amer >90  >90 mL/min   Comment:            The eGFR has been calculated     using the CKD EPI equation.     This calculation has not been     validated in all clinical     situations.     eGFR's persistently     <90 mL/min signify     possible Chronic Kidney Disease.  ETHANOL     Status: None   Collection Time    07/20/12  3:39 PM      Result Value Range   Alcohol, Ethyl (B) <11  0 - 11 mg/dL   Comment:            LOWEST DETECTABLE LIMIT FOR     SERUM ALCOHOL IS 11 mg/dL     FOR MEDICAL PURPOSES ONLY  SALICYLATE LEVEL     Status: Abnormal   Collection Time    07/20/12  3:39 PM      Result Value Range   Salicylate Lvl <2.0 (*) 2.8 - 20.0 mg/dL  POCT PREGNANCY, URINE     Status:  None   Collection Time    07/20/12  4:51 PM      Result Value Range   Preg Test, Ur NEGATIVE  NEGATIVE   Comment:            THE SENSITIVITY OF THIS     METHODOLOGY IS >24 mIU/mL  URINE RAPID DRUG SCREEN (HOSP PERFORMED)     Status: Abnormal   Collection Time    07/20/12  4:54 PM      Result Value Range   Opiates NONE DETECTED  NONE DETECTED   Cocaine NONE DETECTED  NONE DETECTED   Benzodiazepines NONE DETECTED  NONE DETECTED   Amphetamines NONE DETECTED  NONE DETECTED   Tetrahydrocannabinol POSITIVE (*) NONE  DETECTED   Barbiturates NONE DETECTED  NONE DETECTED   Comment:            DRUG SCREEN FOR MEDICAL PURPOSES     ONLY.  IF CONFIRMATION IS NEEDED     FOR ANY PURPOSE, NOTIFY LAB     WITHIN 5 DAYS.                LOWEST DETECTABLE LIMITS     FOR URINE DRUG SCREEN     Drug Class       Cutoff (ng/mL)     Amphetamine      1000     Barbiturate      200     Benzodiazepine   200     Tricyclics       300     Opiates          300     Cocaine          300     THC              50    Physical Findings: AIMS: Facial and Oral Movements Muscles of Facial Expression: None, normal Lips and Perioral Area: None, normal Jaw: None, normal Tongue: None, normal,Extremity Movements Upper (arms, wrists, hands, fingers): None, normal Lower (legs, knees, ankles, toes): None, normal, Trunk Movements Neck, shoulders, hips: None, normal, Overall Severity Severity of abnormal movements (highest score from questions above): None, normal Incapacitation due to abnormal movements: None, normal Patient's awareness of abnormal movements (rate only patient's report): No Awareness, Dental Status Current problems with teeth and/or dentures?: No Does patient usually wear dentures?: No  CIWA:    COWS:     Treatment Plan Summary: Daily contact with patient to assess and evaluate symptoms and progress in treatment Medication management  Plan: Start zoloft at 25mg  to address mood symptoms. Provide supportive counselling and education.  Medical Decision Making Problem Points:  Established problem, stable/improving (1), Review of last therapy session (1) and Review of psycho-social stressors (1) Data Points:  Review of medication regiment & side effects (2) Review of new medications or change in dosage (2)  I certify that inpatient services furnished can reasonably be expected to improve the patient's condition.   Aisley Whan 07/22/2012, 1:07 PM

## 2012-07-22 NOTE — Tx Team (Addendum)
Interdisciplinary Treatment Plan Update   Date Reviewed:  07/22/2012  Time Reviewed:  9:44 AM  Progress in Treatment:   Attending groups: Yes Participating in groups: Yes Taking medication as prescribed: Yes  Tolerating medication: Yes Family/Significant other contact made: No, but will ask patient for consent for collateral contact. Patient understands diagnosis: Yes  Discussing patient identified problems/goals with staff: Yes Medical problems stabilized or resolved: Yes Denies suicidal/homicidal ideation: No, but contracts for safety. Patient has not harmed self or others: Yes  For review of initial/current patient goals, please see plan of care.  Estimated Length of Stay:    Reasons for Continued Hospitalization:  Anxiety Depression Medication stabilization Suicidal ideation  New Problems/Goals identified:    Discharge Plan or Barriers:   Home with outpatient follow up  Additional Comments:  [name] is an 27 y.o. female. Patient was brought to [facility] because of suicidal ideations and chronic pain. Patient has been having worsening depression over the last few weeks. Patient has chronic migraines which have impacted her ability to work steadily.   Patient is endorsing SI but able to contract for safety.  She is rating depression at eight and anxiety at six.  Patient also endorsing migraine headache today.  MD to evaluate for medications.   Attendees:  Patient:  07/22/2012 9:44 AM   Signature: Patrick North, MD 07/22/2012 9:44 AM  Signature: 07/22/2012 9:44 AM  Signature:  07/22/2012 9:44 AM  Signature:Beverly Terrilee Croak, RN 07/22/2012 9:44 AM  Signature:  Neill Loft RN 07/22/2012 9:44 AM  Signature:  Juline Patch, LCSW 07/22/2012 9:44 AM  Signature:  07/22/2012 9:44 AM  Signature: Liliane Bade, BSW 07/22/2012 9:44 AM  Signature: Fransisca Kaufmann, [facility] 07/22/2012 9:44 AM  Signature:    Signature:    Signature:      Scribe for Treatment Team:   Juline Patch,  07/22/2012  9:44 AM

## 2012-07-22 NOTE — Progress Notes (Signed)
D: Patient denies SI/HI/AVH. Patient rates hopelessness as 3,  depression as 3, and anxiety as 2.  Patient affect is flat. Mood is depressed.  Pt states "My meds have been helping.  They are keeping me calm.  My mind is not racing.  I'm at peace with myself.  I feel calm.  I am also happy that I finished my meal today.  I have been having a hard time finishing my food.  So, I am happy that I did today even though that is a small thing."  Patient did attend evening group. Patient visible on the milieu. No distress noted. A: Support and encouragement offered. Scheduled medications given to pt. Q 15 min checks continued for patient safety. R: Patient receptive. Patient remains safe on the unit.

## 2012-07-22 NOTE — Progress Notes (Signed)
Patient ID: [name], female   DOB: 1986/02/27, 27 y.o.   MRN: [id number] D-Patient reports she slept well and that her appetite is good.  Her energy level is low and her ability to pay attention is good.  She rates her depression and her hopelessness at 8.  She has had thoughts of hurting herself in the last 24 hours, but none this morning.  Said she is trying to "deal with the tears this morning"  Says she is feeling sad .  A-Gave pt a journal and encouraged her to write her feelings down.  Talked with her about contracting for safety and substituting positive thoughts when thoughts of SI surface rather than dwelling on them. R- Pt is soft spoken, she looks sad.  She is going to groups and said she will journal feelings.

## 2012-07-22 NOTE — Progress Notes (Signed)
Adult Psychoeducational Group Note  Date:  07/22/2012 Time:  9:10 PM  Group Topic/Focus:  Rediscovering Joy:   The focus of this group is to explore various ways to relieve stress in a positive manner.  Participation Level:  Active  Participation Quality:  Appropriate  Affect:  Appropriate  Cognitive:  Appropriate  Insight: Appropriate  Engagement in Group:  Engaged  Modes of Intervention:  Clarification and Problem-solving  Additional Comments:  [name] was full of life as she smiled during group.  She did not say much but agreed with everyone response.    [name] [place] 07/22/2012, 9:10 PM

## 2012-07-22 NOTE — Progress Notes (Signed)
Patient ID: [name], female   DOB: July 20, 1985, 27 y.o.   MRN: [id number] D- Patient reports sleep with medication.  She says her appetite is poor and that anytime she tries to eat something "heavy" like a nutrigrain bar she feels nauseated.  She has mainly been eating fruit and salad and tolerating these.  She rates he depression and her hopelessness at 8.  She reports thoughts  of self harm off and on and she does contract for safety.  She asked permission to skip group, saying the bright light bothers her head and increases her headache.  Also says it makes her feel irritable.  A- MHT closed drapes near pt in dayroom and R- she agreed to go to group and try to learn from program.  She says the head ache starts on left side and then moves to area behind eyes.  Says she is also missing words when she reads and mixing up letters.
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
## 2012-07-23 NOTE — Progress Notes (Signed)
Patient ID: [name], female   DOB: Apr 16, 1985, 27 y.o.   MRN: [id number] D- Patient reports poor sleep and low energy.  She says her appetite is a little better but she is still being careful not to eat anything too heavy.  She continues to c/o pain in her head and says that lights bother her still.  A- Encouraged to go to groups.  R-She is irritable when roused from bed for group, but does agree to go.  Says that she is forgetful, and did forget to check on her laundry which she was eager to put in laundry.

## 2012-07-23 NOTE — Progress Notes (Signed)
Grief and Loss Group  Group members discussed significant losses in their life and their feelings associated with their losses. Members shared coping strategies that helped them through their grief and loss process.   Pt did not verbally participate in group, and was present and attentive during group.   Sherol Dade Counselor Intern Haroldine Laws

## 2012-07-23 NOTE — [facility] Group Notes (Signed)
[facility] LCSW Group Therapy      Feelings About Diagnosis 1:15 - 2:30 PM          07/23/2012 12:38 PM  Type of Therapy:  Group Therapy  Participation Level:  Active  Participation Quality:  Appropriate  Affect:  Depressed and Flat  Cognitive:  Appropriate  Insight:  Developing/Improving and Engaged  Engagement in Therapy:  Developing/Improving and Engaged  Modes of Intervention:  Discussion, Education, Exploration, Problem-Solving, Rapport Building, Support   Summary of Progress/Problems:  Patient shared she hates herself because of her illness.  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  Writer suggested patient follow up with Vocational Rehab to find an occupation that will work with her limitations.  Wynn Banker 07/23/2012, 12:38 PM

## 2012-07-23 NOTE — Progress Notes (Signed)
Adult Psychoeducational Group Note  Date:  07/23/2012 Time:  11:48 AM  Group Topic/Focus:  Therapeutic Activity- "Apples to Apples"  Participation Level:  Active  Participation Quality:  Appropriate, Attentive and Sharing  Affect:  Appropriate  Cognitive:  Alert and Appropriate  Insight: Appropriate  Engagement in Group:  Engaged  Modes of Intervention:  Activity  Additional Comments:  Pt was appropriate and sharing while attending group. Pt agreed that everyone has different perceptions and this is not a negative thing. Pt also stated that she did not feel that it was unfair that her card was only chosen once during the game.   Sharyn Lull 07/23/2012, 11:48 AM

## 2012-07-23 NOTE — Progress Notes (Signed)
[facility] MD Progress Note  07/23/2012 11:18 AM [name]  MRN:  [id number] Subjective:  Patient reports tolerating zoloft well, denies side effects. Headaches continue to bother her and interfere with her sleep. Mood slowly improving. Attending groups, finding them useful to develop coping skills.  Diagnosis:   Axis I: Major Depression, Recurrent severe Axis II: Deferred Axis III:  Past Medical History  Diagnosis Date  . Headache   . Asthma   . Gastritis   . PUD (peptic ulcer disease)    Axis IV: occupational problems and other psychosocial or environmental problems Axis V: 41-50 serious symptoms  ADL's:  Intact  Sleep: Fair  Appetite:  Fair   Psychiatric Specialty Exam: Review of Systems  Constitutional: Negative.   Eyes: Negative.   Respiratory: Negative.   Cardiovascular: Negative.   Gastrointestinal: Negative.   Genitourinary: Negative.   Musculoskeletal: Negative.   Skin: Negative.   Neurological: Positive for headaches.  Endo/Heme/Allergies: Negative.   Psychiatric/Behavioral: Positive for depression. The patient is nervous/anxious and has insomnia.     Blood pressure 109/78, pulse 101, temperature 98.3 F (36.8 C), temperature source Oral, resp. rate 20, last menstrual period 07/17/2012.There is no weight on file to calculate BMI.  General Appearance: Casual  Eye Contact::  Fair  Speech:  Clear and Coherent  Volume:  Decreased  Mood:  Anxious and Depressed  Affect:  Constricted  Thought Process:  Coherent  Orientation:  Full (Time, Place, and Person)  Thought Content:  WDL  Suicidal Thoughts:  No  Homicidal Thoughts:  No  Memory:  Immediate;   Fair Recent;   Fair Remote;   Fair  Judgement:  Fair  Insight:  Present  Psychomotor Activity:  Normal  Concentration:  Fair  Recall:  Fair  Akathisia:  No  Handed:  Right  AIMS (if indicated):     Assets:  Communication Skills Desire for Improvement Housing Vocational/Educational  Sleep:  Number of  Hours: 3.75   Current Medications: Current Facility-Administered Medications  Medication Dose Route Frequency Provider Last Rate Last Dose  . acetaminophen (TYLENOL) tablet 650 mg  650 mg Oral Q6H PRN Jorje Guild, PA-C   650 mg at 07/21/12 2144  . alum & mag hydroxide-simeth (MAALOX/MYLANTA) 200-200-20 MG/5ML suspension 30 mL  30 mL Oral Q4H PRN Jorje Guild, PA-C      . diphenhydrAMINE (BENADRYL) capsule 50 mg  50 mg Oral QHS PRN,MR X 1 Larena Sox, MD   50 mg at 07/23/12 0058  . divalproex (DEPAKOTE ER) 24 hr tablet 250 mg  250 mg Oral BID Jorje Guild, PA-C   250 mg at 07/23/12 2440  . magnesium hydroxide (MILK OF MAGNESIA) suspension 30 mL  30 mL Oral Daily PRN Jorje Guild, PA-C      . nabumetone (RELAFEN) tablet 500 mg  500 mg Oral BID Jorje Guild, PA-C   500 mg at 07/23/12 1027  . ondansetron (ZOFRAN) tablet 4 mg  4 mg Oral Q8H PRN Jorje Guild, PA-C      . sertraline (ZOLOFT) tablet 25 mg  25 mg Oral Daily Cambren Helm, MD   25 mg at 07/23/12 2536  . SUMAtriptan (IMITREX) tablet 50 mg  50 mg Oral Q2H PRN Jorje Guild, PA-C   50 mg at 07/21/12 2144    Lab Results: No results found for this or any previous visit (from the past 48 hour(s)).  Physical Findings: AIMS: Facial and Oral Movements Muscles of Facial Expression: None, normal Lips and Perioral Area: None, normal Jaw:  None, normal Tongue: None, normal,Extremity Movements Upper (arms, wrists, hands, fingers): None, normal Lower (legs, knees, ankles, toes): None, normal, Trunk Movements Neck, shoulders, hips: None, normal, Overall Severity Severity of abnormal movements (highest score from questions above): None, normal Incapacitation due to abnormal movements: None, normal Patient's awareness of abnormal movements (rate only patient's report): No Awareness, Dental Status Current problems with teeth and/or dentures?: No Does patient usually wear dentures?: No  CIWA:    COWS:     Treatment Plan Summary: Daily contact with patient to  assess and evaluate symptoms and progress in treatment Medication management  Plan: Continue current plan of care. Provide supportive counselling and education. Plan for discharge once stabilized.  Medical Decision Making Problem Points:  Established problem, stable/improving (1), Review of last therapy session (1) and Review of psycho-social stressors (1) Data Points:  Review of medication regiment & side effects (2)  I certify that inpatient services furnished can reasonably be expected to improve the patient's condition.   Neysa Arts 07/23/2012, 11:18 AM

## 2012-07-23 NOTE — Progress Notes (Signed)
Writer spoke with patient 1:1 and she reports that she has been in her room a lot d/t the fact that the lights on the unit trigger her migraines. Patient reported to writer that her goal is to seek counseling and stay on her medications to help manage the environment on a daily basis. Writer offered patient support and encouragement. Writer informed patient of medications available and she is agreeable to plan of care. Patient currently denies si/hi/a/v hallucinations. Safety maintained on unit with 15 min checks, will continue to monitor.

## 2012-07-23 NOTE — Progress Notes (Signed)
Adult Psychoeducational Group Note  Date:  07/23/2012 Time:  9:36 PM  Group Topic/Focus:  Wrap-Up Group:   The focus of this group is to help patients review their daily goal of treatment and discuss progress on daily workbooks.  Participation Level:  Minimal  Participation Quality:  Inattentive  Affect:  Appropriate  Cognitive:  Appropriate  Insight: None  Engagement in Group:  None  Modes of Intervention:  Discussion and Problem-solving  Additional Comments:  Patient entered group late, and left group early to return to her room.   Lyndee Hensen 07/23/2012, 9:36 PM

## 2012-07-24 MED ORDER — ONDANSETRON 4 MG PO TBDP
4.0000 mg | ORAL_TABLET | Freq: Three times a day (TID) | ORAL | Status: DC | PRN
  Administered 2012-07-24 – 2012-07-25 (×2): 4 mg via ORAL

## 2012-07-24 NOTE — Plan of Care (Signed)
Problem: Ineffective individual coping Goal: STG: Patient will participate in after care plan Patient is attending aftefcare group and talking about issues leading to hospital admission.  Horace Porteous Tanea Moga, LCSW 07/23/2012 Outcome: Completed/Met Date Met:  07/24/12 Patient is attending all groups and talking about issues.  She has [facility] scheduled with [facility].  Horace Porteous Zae Kirtz, LCSW 07/24/2012

## 2012-07-24 NOTE — Progress Notes (Signed)
Pt reported her sleep well appetite good energy level normal and ability to pay attention as improving.  She denied any depression 1 on her hopelessness and 2 for her anxiety.  She stated,"It is 2 because I have social anxiety".  She denies any S/H ideation or A/V hallucinations.  She is unsure of her discharge plan but may be ready tomorrow by looking at md and caseworker notes.

## 2012-07-24 NOTE — [facility] Group Notes (Signed)
[facility] LCSW Group Therapy      Emotional Regulation 1:15 - 2:30 PM        07/24/2012 3:12 PM  Type of Therapy:  Group Therapy  Participation Level:  Active  Participation Quality:  Appropriate  Affect:  Appropriate and Depressed  Cognitive:  Alert and Appropriate  Insight:  Engaged and Improving  Engagement in Therapy:  Engaged and Improving  Modes of Intervention:  Discussion, Education, Exploration, Problem-Solving, Rapport Building, Support  Summary of Progress/Problems:  Patient shared she has problems with her emotions when she is blamed for things she did not do.  She shared her mother often comes home angry from work and seems to take her anger out on her and other family members.  Patient shared she is now living with her girlfriend to avoid some of the family problems.  Wynn Banker 07/24/2012, 3:12 PM

## 2012-07-24 NOTE — Progress Notes (Signed)
Recreation Therapy Notes  Date: 04.30.2014  Time: 3:05pm  Location: [address] Day[address]   Group Topic/Focus: Goal Setting   Participation Level:  Did not attend   [employer], LRT/CTRS  Jearl Klinefelter 07/24/2012 4:22 PM

## 2012-07-24 NOTE — [facility] Suicide Risk Assessment (Signed)
[facility] INPATIENT:  Family/Significant Other Suicide Prevention Education  Suicide Prevention Education:  Education Completed; Luberta Robertson, [phone]; has been identified by the patient as the family member/significant other with whom the patient will be residing, and identified as the person(s) who will aid the patient in the event of a mental health crisis (suicidal ideations/suicide attempt).  With written consent from the patient, the family member/significant other has been provided the following suicide prevention education, prior to the and/or following the discharge of the patient.  The suicide prevention education provided includes the following:  Suicide risk factors  Suicide prevention and interventions  National Suicide Hotline telephone number  [facility] assessment telephone number  [facility] Emergency Assistance 911  [facility] and/or Residential Mobile Crisis Unit telephone number  Request made of family/significant other to:  Remove weapons (e.g., guns, rifles, knives), all items previously/currently identified as safety concern.  Girlfriend reports there are no guns in the home.    Remove drugs/medications (over-the-counter, prescriptions, illicit drugs), all items previously/currently identified as a safety concern.  The family member/significant other verbalizes understanding of the suicide prevention education information provided.  The family member/significant other agrees to remove the items of safety concern listed above.  Wynn Banker 07/24/2012, 12:50 PM

## 2012-07-24 NOTE — Plan of Care (Signed)
Problem: Alteration in mood Goal: LTG-Pt's behavior demonstrates decreased signs of depression Patient is rating depression at four.  Horace Porteous Lori Blackwelder, LCSW 07/23/2012 ]  Outcome: Completed/Met Date Met:  07/24/12 Patient reports feeling much better and recognizes she is an intelligent and smart person with a lot offer.  She is rating depression at zero.  Horace Porteous Lori Kelty, LCSW .07/24/2012

## 2012-07-24 NOTE — Progress Notes (Signed)
[facility] MD Progress Note  07/24/2012 1:36 PM [name]  MRN:  [id number] Subjective:  Patient reports doing better, tolerating Zoloft well. Feels the hospitalization has been very helpful. States her girlfriend is the only positive person in her life. Diagnosis:   Axis I: Major Depression, Recurrent severe Axis II: Deferred Axis III:  Past Medical History  Diagnosis Date  . Headache   . Asthma   . Gastritis   . PUD (peptic ulcer disease)    Axis IV: occupational problems and other psychosocial or environmental problems Axis V: 41-50 serious symptoms  ADL's:  Intact  Sleep: Fair  Appetite:  Fair   Psychiatric Specialty Exam: Review of Systems  Constitutional: Negative.   Eyes: Negative.   Respiratory: Negative.   Cardiovascular: Negative.   Gastrointestinal: Negative.   Genitourinary: Negative.   Musculoskeletal: Negative.   Skin: Negative.   Neurological: Positive for headaches.  Endo/Heme/Allergies: Negative.   Psychiatric/Behavioral: Positive for depression.    Blood pressure 110/76, pulse 101, temperature 97.9 F (36.6 C), temperature source Oral, resp. rate 20, last menstrual period 07/17/2012.There is no weight on file to calculate BMI.  General Appearance: Casual  Eye Contact::  Fair  Speech:  Clear and Coherent  Volume:  Normal  Mood:  Anxious and Dysphoric  Affect:  Constricted  Thought Process:  Coherent  Orientation:  Full (Time, Place, and Person)  Thought Content:  WDL  Suicidal Thoughts:  No  Homicidal Thoughts:  No  Memory:  Immediate;   Fair Recent;   Fair Remote;   Fair  Judgement:  Fair  Insight:  Present  Psychomotor Activity:  Normal  Concentration:  Fair  Recall:  Fair  Akathisia:  No  Handed:  Right  AIMS (if indicated):     Assets:  Communication Skills Desire for Improvement Housing Social Support  Sleep:  Number of Hours: 6.75   Current Medications: Current Facility-Administered Medications  Medication Dose Route Frequency  Provider Last Rate Last Dose  . acetaminophen (TYLENOL) tablet 650 mg  650 mg Oral Q6H PRN Jorje Guild, PA-C   650 mg at 07/21/12 2144  . alum & mag hydroxide-simeth (MAALOX/MYLANTA) 200-200-20 MG/5ML suspension 30 mL  30 mL Oral Q4H PRN Jorje Guild, PA-C   30 mL at 07/24/12 0554  . diphenhydrAMINE (BENADRYL) capsule 50 mg  50 mg Oral QHS PRN,MR X 1 Larena Sox, MD   50 mg at 07/23/12 2202  . divalproex (DEPAKOTE ER) 24 hr tablet 250 mg  250 mg Oral BID Jorje Guild, PA-C   250 mg at 07/24/12 0810  . magnesium hydroxide (MILK OF MAGNESIA) suspension 30 mL  30 mL Oral Daily PRN Jorje Guild, PA-C      . nabumetone (RELAFEN) tablet 500 mg  500 mg Oral BID Jorje Guild, PA-C   500 mg at 07/24/12 0810  . ondansetron (ZOFRAN-ODT) disintegrating tablet 4 mg  4 mg Oral Q8H PRN Rilla Buckman, MD   4 mg at 07/24/12 1140  . sertraline (ZOLOFT) tablet 25 mg  25 mg Oral Daily Gedalia Mcmillon, MD   25 mg at 07/24/12 0810  . SUMAtriptan (IMITREX) tablet 50 mg  50 mg Oral Q2H PRN Jorje Guild, PA-C   50 mg at 07/23/12 2202    Lab Results: No results found for this or any previous visit (from the past 48 hour(s)).  Physical Findings: AIMS: Facial and Oral Movements Muscles of Facial Expression: None, normal Lips and Perioral Area: None, normal Jaw: None, normal Tongue: None, normal,Extremity Movements  Upper (arms, wrists, hands, fingers): None, normal Lower (legs, knees, ankles, toes): None, normal, Trunk Movements Neck, shoulders, hips: None, normal, Overall Severity Severity of abnormal movements (highest score from questions above): None, normal Incapacitation due to abnormal movements: None, normal Patient's awareness of abnormal movements (rate only patient's report): No Awareness, Dental Status Current problems with teeth and/or dentures?: No Does patient usually wear dentures?: No  CIWA:    COWS:     Treatment Plan Summary: Daily contact with patient to assess and evaluate symptoms and progress in  treatment Medication management  Plan: Continue current plan of care. Plan for discharge tomorrow if stabilized. Medical Decision Making Problem Points:  Established problem, stable/improving (1), Review of last therapy session (1) and Review of psycho-social stressors (1) Data Points:  Review of medication regiment & side effects (2)  I certify that inpatient services furnished can reasonably be expected to improve the patient's condition.   [name] 07/24/2012, 1:36 PM

## 2012-07-24 NOTE — Progress Notes (Signed)
Adult Psychoeducational Group Note  Date:  07/24/2012 Time:  11:00 AM  Group Topic/Focus:  Therapeutic Activity- Our Perception "Apples to Apples"  Participation Level:  Active  Participation Quality:  Appropriate, Attentive and Sharing  Affect:  Appropriate  Cognitive:  Alert and Appropriate  Insight: Appropriate  Engagement in Group:  Engaged  Modes of Intervention:  Activity  Additional Comments:  Pt was appropriate and sharing while attending group. Pt participated in a therapeutic activity that highlighted every patient's perception. Pt appeared pleasant and excited about the interactions that took place during this activity.   Sharyn Lull 07/24/2012, 11:00 AM

## 2012-07-24 NOTE — Progress Notes (Signed)
Patient ID: [name], female   DOB: 24-Aug-1985, 27 y.o.   MRN: [id number] D: Patient in room on approach. Pt stated she "feels happy today". Pt has little interaction with peers because of migraines but stated able to attend groups. Pt mood/affect is appropriate to situation.  Pt denies SI/HI/AVH and pain. Pt attended evening wrap up group with head covered with in a jacket not to trigger a migraine from the lights. Pt denies any needs or concerns.  Cooperative with assessment. No acute distressed noted at this time.   A: Met with pt 1:1. Medications administered as prescribed. Writer encouraged pt to discuss feelings. Pt encouraged to come to staff with any question or concerns. 15 minutes checks for safety.  R: Patient remains safe. She is complaint with medications and denies any adverse reaction. Continue current POC.

## 2012-07-24 NOTE — [facility] Group Notes (Signed)
[facility] LCSW Aftercare Discharge Planning Group Note   07/24/2012 12:55 PM  Participation Quality:  Appropriate  Mood/Affect:  Appropriate  Depression Rating:  0  Anxiety Rating:  1  Thoughts of Suicide:  No  Will you contract for safety?   NA  Current AVH:  No  Plan for Discharge/Comments:  Patient reports being much better today.  She states she realizes she is an intelligent, smart person with a lot to offer.  She is looking forward to discharging home tomorrow.  Patient advised of Vocational Rehab as an options for being access for work opportunity that will need her needs.    Transportation Means: Patient has transportation.  Supports:  Patient has a good support system.   Shaneece Stockburger, Joesph July

## 2012-07-25 DIAGNOSIS — F331 Major depressive disorder, recurrent, moderate: Principal | ICD-10-CM

## 2012-07-25 MED ORDER — DIVALPROEX SODIUM ER 250 MG PO TB24: 250.0000 mg | ORAL_TABLET | Freq: Two times a day (BID) | ORAL | Status: DC

## 2012-07-25 MED ORDER — NABUMETONE 500 MG PO TABS: 500.0000 mg | ORAL_TABLET | Freq: Two times a day (BID) | ORAL | Status: DC

## 2012-07-25 MED ORDER — SUMATRIPTAN SUCCINATE 50 MG PO TABS
50.0000 mg | ORAL_TABLET | ORAL | Status: DC | PRN
Start: 2012-07-25 — End: 2012-09-06

## 2012-07-25 MED ORDER — SERTRALINE HCL 25 MG PO TABS: 25.0000 mg | ORAL_TABLET | Freq: Every day | ORAL | Status: DC

## 2012-07-25 NOTE — Progress Notes (Signed)
[facility] Adult Case Management Discharge Plan :  Will you be returning to the same living situation after discharge: Yes,  home At discharge, do you have transportation home?:Yes,  able to have a ride Do you have the ability to pay for your medications:Yes,  no barriers  Release of information consent forms completed and in the chart;  Patient's signature needed at discharge.  Patient to Follow up at: Follow-up Information   Follow up with [facility] On 07/29/2012. (Please go to [facility]'s walk in clinic on Friday, Jul 26, 2012 or any weekday between 8AM-3PM for medication managment.)    Contact information:   [address] [place], [place]   [address]  [phone]      Follow up with [facility] On 07/30/2012. (You are scheduled to see Thompson Caul Tuesday, Jul 30, 2012 at 2:00 PM)    Contact information:    [address] [place], [place]   [address]  [phone]      Patient denies SI/HI:   Yes,  no reports of SI    Safety Planning and Suicide Prevention discussed:  Yes,  completed with patient and family member  Nail, [name] 07/25/2012, 9:54 AM

## 2012-07-25 NOTE — [facility] Group Notes (Signed)
[facility] LCSW Group Therapy  07/25/2012 3:38 PM  Type of Therapy:  Group Therapy  Participation Level:  Active  Participation Quality:  Attentive and Sharing  Affect:  Appropriate  Reports migraine headache but remains happy and open  Cognitive:  Alert, Appropriate and Oriented  Insight:  Developing/Improving  Engagement in Therapy:  Engaged  Modes of Intervention:  Discussion  Summary of Progress/Problems:  [facility] speaker [name] came to be part of group this afternoon.  [name] was very engaged, took the jacket off of her head and asked questions about how to control flashhbacks.  She engaged with learning coping skills and within discussion with speaker  Nail, [name] 07/25/2012, 3:38 PM

## 2012-07-25 NOTE — Progress Notes (Signed)
Patient ID: [name], female   DOB: 10/28/1985, 27 y.o.   MRN: [id number] D: "I am ready to go home."  A: Pt. Denies lethality and A/V/H's or other problems and states she is really readyto go home.  Pt. Interacting with staff and peers appropriately and is attending groups.  Pt.'s migraine headaches are controlled with Imitrex, though Pt. admitted she is reluctant to take it during the day, since it makes her sleepy.    R: Will continue to monitor and will assist with her discharge instructions.

## 2012-07-25 NOTE — [facility] Suicide Risk Assessment (Signed)
Suicide Risk Assessment  Discharge Assessment     Demographic Factors:  Low socioeconomic status and Unemployed, Female, African american  Mental Status Per Nursing Assessment::   On Admission:  Suicidal ideation indicated by patient  Current Mental Status by Physician: Patioent alert and oriented to 4. Denies AH/VH/SI/HI.  Loss Factors: Decline in physical health and Financial problems/change in socioeconomic status  Historical Factors: Impulsivity  Risk Reduction Factors:   Positive social support and Positive coping skills or problem solving skills  Continued Clinical Symptoms:  Depression:   Recent sense of peace/wellbeing  Cognitive Features That Contribute To Risk:  Cognitively intact  Suicide Risk:  Minimal: No identifiable suicidal ideation.  Patients presenting with no risk factors but with morbid ruminations; may be classified as minimal risk based on the severity of the depressive symptoms  Discharge Diagnoses:   AXIS I:  Major Depression, Recurrent severe AXIS II:  Deferred AXIS III:   Past Medical History  Diagnosis Date  . Headache   . Asthma   . Gastritis   . PUD (peptic ulcer disease)    AXIS IV:  occupational problems and other psychosocial or environmental problems AXIS V:  61-70 mild symptoms  Plan Of Care/Follow-up recommendations:  Activity:  As tolerated Diet:  regular Follow up with outpatient appointments.  Is patient on multiple antipsychotic therapies at discharge:  No   Has Patient had three or more failed trials of antipsychotic monotherapy by history:  No  Recommended Plan for Multiple Antipsychotic Therapies: NA  [name] 07/25/2012, 10:17 AM

## 2012-07-25 NOTE — Progress Notes (Signed)
Pt was discharged with all of her instructions. Pt hadtold her nurse Jeannie prior to dischrtge she was not SI or HI-Pt was taken to her locker by [place] and obtained her belongings.

## 2012-07-25 NOTE — [facility] Group Notes (Addendum)
[facility] LCSW Aftercare Discharge Planning Group Note   07/25/2012 9:24 AM  Participation Quality:  Appropriate  Mood/Affect:  Appropriate  Depression Rating:  0  Anxiety Rating:  1  Thoughts of Suicide:  No  Will you contract for safety?   NA  Current AVH:  No  Plan for Discharge/Comments:  Patient has her head covered and sunglasses on her face reporting a migraine.  Patient reports she is feeling good, ready to get back to life and be home.  Patient advised of Vocational Rehab as an options for being access for work opportunity that will need her needs.  Patient has aftercare in place.  Transportation Means: Patient has transportation.  Supports:  Patient has a good support system.   [name], [name]
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
## 2012-07-30 NOTE — Progress Notes (Signed)
Patient Discharge Instructions:  After Visit Summary (AVS):   Faxed to:  07/30/12 Discharge Summary Note:   Faxed to:  07/30/12 Psychiatric Admission Assessment Note:   Faxed to:  07/30/12 Suicide Risk Assessment - Discharge Assessment:   Faxed to:  07/30/12 Faxed/Sent to the Next Level Care provider:  07/30/12 Faxed to [facility] @ [phone] Faxed to [facility] @ [phone]  Jerelene Redden, 07/30/2012, 3:04 PM

## 2012-09-06 ENCOUNTER — Ambulatory Visit: Payer: No Typology Code available for payment source | Attending: Family Medicine | Admitting: Internal Medicine

## 2012-09-06 VITALS — BP 130/89 | HR 80 | Temp 98.7°F | Resp 16 | Wt 164.0 lb

## 2012-09-06 DIAGNOSIS — G43909 Migraine, unspecified, not intractable, without status migrainosus: Secondary | ICD-10-CM | POA: Insufficient documentation

## 2012-09-06 DIAGNOSIS — R51 Headache: Secondary | ICD-10-CM

## 2012-09-06 MED ORDER — IBUPROFEN 600 MG PO TABS: 600.0000 mg | ORAL_TABLET | Freq: Three times a day (TID) | ORAL | Status: DC | PRN

## 2012-09-06 MED ORDER — SUMATRIPTAN SUCCINATE 50 MG PO TABS
50.0000 mg | ORAL_TABLET | Freq: Four times a day (QID) | ORAL | Status: DC | PRN
Start: 2012-09-06 — End: 2012-10-02

## 2012-09-06 NOTE — Progress Notes (Signed)
Patient ID: [name], female   DOB: [birth year], 27 y.o.   MRN: [id number]  Patient Demographics  [name], is a 27 y.o. female  WUJ:[id number]  [id number]  DOB - [birth year]  Chief Complaint  Patient presents with  . [name]        Subjective:   [name] today has, No headache, No chest pain, No abdominal pain - No Nausea, No new weakness tingling or numbness, No Cough - SOB.   He should history of bipolar disorder, migraines, recently admitted to psych hospital was suicidal thoughts, comes in for medications for migraine headaches, she is really bizarre ideation, she would not take her glasses off, she will not let me examine her, she denies any present suicidal thoughts however does not want to provide history but wanted to get medications, I told her that without examination and history to be impossible for me to prescribe medications. I told her that if she is having any psychological issues she should get seen by her psychiatrist immediately, this was in presence of nurse is. Patient again denies any suicidal homicidal thoughts right now. However she stomped out of the room. Patient's mother then came and provided some history along with the patient. Patient's mother apologized for patient's behavior and said that she had some psych issues and she would take to the psychiatrist immediately.  Objective:   Past Medical History  Diagnosis Date  . Headache(784.0)   . Asthma   . Gastritis   . PUD (peptic ulcer disease)       History reviewed. No pertinent past surgical history.   Filed Vitals:   09/06/12 0950  BP: 130/89  Pulse: 80  Temp: 98.7 F (37.1 C)  Resp: 16  Weight: 164 lb (74.39 kg)  SpO2: 98%     Exam  Awake Alert, agent refused examination    Data Review   CBC No results found for this basename: WBC, HGB, HCT, PLT, MCV, MCH, MCHC, RDW, NEUTRABS, LYMPHSABS, MONOABS, EOSABS, BASOSABS, BANDABS, BANDSABD,  in the last 168  hours  Chemistries   No results found for this basename: NA, K, CL, CO2, GLUCOSE, BUN, CREATININE, GFRCGP, CALCIUM, MG, AST, ALT, ALKPHOS, BILITOT,  in the last 168 hours ------------------------------------------------------------------------------------------------------------------ No results found for this basename: HGBA1C,  in the last 72 hours ------------------------------------------------------------------------------------------------------------------ No results found for this basename: CHOL, HDL, LDLCALC, TRIG, CHOLHDL, LDLDIRECT,  in the last 72 hours ------------------------------------------------------------------------------------------------------------------ No results found for this basename: TSH, T4TOTAL, FREET3, T3FREE, THYROIDAB,  in the last 72 hours ------------------------------------------------------------------------------------------------------------------ No results found for this basename: VITAMINB12, FOLATE, FERRITIN, TIBC, IRON, RETICCTPCT,  in the last 72 hours  Coagulation profile  No results found for this basename: INR, PROTIME,  in the last 168 hours     Prior to Admission medications   Medication Sig Start Date End Date Taking? Authorizing Provider  divalproex (DEPAKOTE ER) 250 MG 24 hr tablet Take 1 tablet (250 mg total) by mouth 2 (two) times daily. 07/25/12   Nanine Means, NP  nabumetone (RELAFEN) 500 MG tablet Take 1 tablet (500 mg total) by mouth 2 (two) times daily. 07/25/12   Nanine Means, NP  sertraline (ZOLOFT) 25 MG tablet Take 1 tablet (25 mg total) by mouth daily. 07/25/12   Nanine Means, NP  SUMAtriptan (IMITREX) 50 MG tablet Take 1 tablet (50 mg total) by mouth every 2 (two) hours as needed. 07/25/12   Nanine Means, NP     Assessment & Plan   Migraine headaches. Patient refused  examination, stone out of the room. She has history of bipolar disorder/depression/recent hospitalization to psych hospital for psych issues. I have requested her  to seek psych immediately. Currently not suicidal homicidal.  Patient's mother came and provided further history, patient is living with a female partner and mother says she cannot be pregnant, patient confirms that, patient will be given refills of Imitrex which he has run out, I will also place her on ibuprofen for headaches. Patient's mother requested to take her to the psychiatrist immediately.       Leroy Sea M.D on 09/06/2012 at 10:34 AM

## 2012-09-06 NOTE — Progress Notes (Signed)
pateint states suffers from migranes Congestion and runny nose-does not feel well

## 2012-10-02 ENCOUNTER — Emergency Department (HOSPITAL_COMMUNITY)
Admission: EM | Admit: 2012-10-02 | Discharge: 2012-10-02 | Disposition: A | Discharge status: Home / Self Care | Payer: No Typology Code available for payment source | Attending: Emergency Medicine | Admitting: Emergency Medicine

## 2012-10-02 ENCOUNTER — Emergency Department (HOSPITAL_COMMUNITY): Payer: No Typology Code available for payment source

## 2012-10-02 ENCOUNTER — Encounter (HOSPITAL_COMMUNITY): Payer: Self-pay | Admitting: Family Medicine

## 2012-10-02 DIAGNOSIS — Y929 Unspecified place or not applicable: Secondary | ICD-10-CM | POA: Insufficient documentation

## 2012-10-02 DIAGNOSIS — Y939 Activity, unspecified: Secondary | ICD-10-CM | POA: Insufficient documentation

## 2012-10-02 DIAGNOSIS — Z8711 Personal history of peptic ulcer disease: Secondary | ICD-10-CM | POA: Insufficient documentation

## 2012-10-02 DIAGNOSIS — S39012A Strain of muscle, fascia and tendon of lower back, initial encounter: Secondary | ICD-10-CM

## 2012-10-02 DIAGNOSIS — S7002XA Contusion of left hip, initial encounter: Secondary | ICD-10-CM

## 2012-10-02 DIAGNOSIS — W19XXXA Unspecified fall, initial encounter: Secondary | ICD-10-CM

## 2012-10-02 DIAGNOSIS — H53149 Visual discomfort, unspecified: Secondary | ICD-10-CM | POA: Insufficient documentation

## 2012-10-02 DIAGNOSIS — J45909 Unspecified asthma, uncomplicated: Secondary | ICD-10-CM | POA: Insufficient documentation

## 2012-10-02 DIAGNOSIS — Z8719 Personal history of other diseases of the digestive system: Secondary | ICD-10-CM | POA: Insufficient documentation

## 2012-10-02 DIAGNOSIS — S335XXA Sprain of ligaments of lumbar spine, initial encounter: Secondary | ICD-10-CM | POA: Insufficient documentation

## 2012-10-02 DIAGNOSIS — S7000XA Contusion of unspecified hip, initial encounter: Secondary | ICD-10-CM | POA: Insufficient documentation

## 2012-10-02 DIAGNOSIS — F17200 Nicotine dependence, unspecified, uncomplicated: Secondary | ICD-10-CM | POA: Insufficient documentation

## 2012-10-02 DIAGNOSIS — W108XXA Fall (on) (from) other stairs and steps, initial encounter: Secondary | ICD-10-CM | POA: Insufficient documentation

## 2012-10-02 DIAGNOSIS — Z79899 Other long term (current) drug therapy: Secondary | ICD-10-CM | POA: Insufficient documentation

## 2012-10-02 DIAGNOSIS — S0990XA Unspecified injury of head, initial encounter: Secondary | ICD-10-CM | POA: Insufficient documentation

## 2012-10-02 MED ORDER — OXYCODONE-ACETAMINOPHEN 5-325 MG PO TABS
1.0000 | ORAL_TABLET | Freq: Once | ORAL | Status: AC
  Administered 2012-10-02: 1 via ORAL
  Filled 2012-10-02: qty 1

## 2012-10-02 MED ORDER — HYDROCODONE-ACETAMINOPHEN 5-325 MG PO TABS: 1.0000 | ORAL_TABLET | Freq: Four times a day (QID) | ORAL | Status: DC | PRN

## 2012-10-02 MED ORDER — ONDANSETRON 4 MG PO TBDP
8.0000 mg | ORAL_TABLET | Freq: Once | ORAL | Status: AC
  Administered 2012-10-02: 8 mg via ORAL
  Filled 2012-10-02: qty 2

## 2012-10-02 MED ORDER — CYCLOBENZAPRINE HCL 10 MG PO TABS: 10.0000 mg | ORAL_TABLET | Freq: Two times a day (BID) | ORAL | Status: DC | PRN

## 2012-10-02 NOTE — ED Notes (Signed)
Per pt sts had a fall down some stairs this am and is now having back pain, head pain and leg pain. sts she hit her head but denies LOC. Pt hx of chronic HA.

## 2012-10-02 NOTE — Discharge Instructions (Signed)
Your x-rays are normal. Take norco for your pain. Flexeril for severe pain. Get some rest. Try ice packs and heat to your back. Follow up with your doctor  Lumbosacral Strain Lumbosacral strain is one of the most common causes of back pain. There are many causes of back pain. Most are not serious conditions. CAUSES  Your backbone (spinal column) is made up of 24 main vertebral bodies, the sacrum, and the coccyx. These are held together by muscles and tough, fibrous tissue (ligaments). Nerve roots pass through the openings between the vertebrae. A sudden move or injury to the back may cause injury to, or pressure on, these nerves. This may result in localized back pain or pain movement (radiation) into the buttocks, down the leg, and into the foot. Sharp, shooting pain from the buttock down the back of the leg (sciatica) is frequently associated with a ruptured (herniated) disk. Pain may be caused by muscle spasm alone. Your caregiver can often find the cause of your pain by the details of your symptoms and an exam. In some cases, you may need tests (such as X-rays). Your caregiver will work with you to decide if any tests are needed based on your specific exam. HOME CARE INSTRUCTIONS   Avoid an underactive lifestyle. Active exercise, as directed by your caregiver, is your greatest weapon against back pain.  Avoid hard physical activities (tennis, racquetball, waterskiing) if you are not in proper physical condition for it. This may aggravate or create problems.  If you have a back problem, avoid sports requiring sudden body movements. Swimming and walking are generally safer activities.  Maintain good posture.  Avoid becoming overweight (obese).  Use bed rest for only the most extreme, sudden (acute) episode. Your caregiver will help you determine how much bed rest is necessary.  For acute conditions, you may put ice on the injured area.  Put ice in a plastic bag.  Place a towel between your  skin and the bag.  Leave the ice on for 15-20 minutes at a time, every 2 hours, or as needed.  After you are improved and more active, it may help to apply heat for 30 minutes before activities. See your caregiver if you are having pain that lasts longer than expected. Your caregiver can advise appropriate exercises or therapy if needed. With conditioning, most back problems can be avoided. SEEK IMMEDIATE MEDICAL CARE IF:   You have numbness, tingling, weakness, or problems with the use of your arms or legs.  You experience severe back pain not relieved with medicines.  There is a change in bowel or bladder control.  You have increasing pain in any area of the body, including your belly (abdomen).  You notice shortness of breath, dizziness, or feel faint.  You feel sick to your stomach (nauseous), are throwing up (vomiting), or become sweaty.  You notice discoloration of your toes or legs, or your feet get very cold.  Your back pain is getting worse.  You have a fever. MAKE SURE YOU:   Understand these instructions.  Will watch your condition.  Will get help right away if you are not doing well or get worse. Document Released: 12/21/2004 Document Revised: 06/05/2011 Document Reviewed: 06/12/2008 [facility] Patient Information 2014 [place], [place]. Migraine Headache A migraine headache is an intense, throbbing pain on one or both sides of your head. A migraine can last for 30 minutes to several hours. CAUSES  The exact cause of a migraine headache is not always known. However,  a migraine may be caused when nerves in the brain become irritated and release chemicals that cause inflammation. This causes pain. SYMPTOMS  Pain on one or both sides of your head.  Pulsating or throbbing pain.  Severe pain that prevents daily activities.  Pain that is aggravated by any physical activity.  Nausea, vomiting, or both.  Dizziness.  Pain with exposure to bright lights, loud noises,  or activity.  General sensitivity to bright lights, loud noises, or smells. Before you get a migraine, you may get warning signs that a migraine is coming (aura). An aura may include:  Seeing flashing lights.  Seeing bright spots, halos, or zig-zag lines.  Having tunnel vision or blurred vision.  Having feelings of numbness or tingling.  Having trouble talking.  Having muscle weakness. MIGRAINE TRIGGERS  Alcohol.  Smoking.  Stress.  Menstruation.  Aged cheeses.  Foods or drinks that contain nitrates, glutamate, aspartame, or tyramine.  Lack of sleep.  Chocolate.  Caffeine.  Hunger.  Physical exertion.  Fatigue.  Medicines used to treat chest pain (nitroglycerine), birth control pills, estrogen, and some blood pressure medicines. DIAGNOSIS  A migraine headache is often diagnosed based on:  Symptoms.  Physical examination.  A CT scan or MRI of your head. TREATMENT Medicines may be given for pain and nausea. Medicines can also be given to help prevent recurrent migraines.  HOME CARE INSTRUCTIONS  Only take over-the-counter or prescription medicines for pain or discomfort as directed by your caregiver. The use of long-term narcotics is not recommended.  Lie down in a dark, quiet room when you have a migraine.  Keep a journal to find out what may trigger your migraine headaches. For example, write down:  What you eat and drink.  How much sleep you get.  Any change to your diet or medicines.  Limit alcohol consumption.  Quit smoking if you smoke.  Get 7 to 9 hours of sleep, or as recommended by your caregiver.  Limit stress.  Keep lights dim if bright lights bother you and make your migraines worse. SEEK IMMEDIATE MEDICAL CARE IF:   Your migraine becomes severe.  You have a fever.  You have a stiff neck.  You have vision loss.  You have muscular weakness or loss of muscle control.  You start losing your balance or have trouble  walking.  You feel faint or pass out.  You have severe symptoms that are different from your first symptoms. MAKE SURE YOU:   Understand these instructions.  Will watch your condition.  Will get help right away if you are not doing well or get worse. Document Released: 03/13/2005 Document Revised: 06/05/2011 Document Reviewed: 03/03/2011 [facility] Patient Information 2014 [place], [place].

## 2012-10-02 NOTE — ED Provider Notes (Signed)
History    CSN: [id number] Arrival date & time 10/02/12  1211  First MD Initiated Contact with Patient 10/02/12 1221     Chief Complaint  Patient presents with  . Fall   (Consider location/radiation/quality/duration/timing/severity/associated sxs/prior Treatment) HPI [name] is a 27 y.o. female who present to ED with complaint of a fall. States fell down about 10steps after missing as step. Friend who witnessed her fall states she fell forward, hitting head and landing on left side. States pain to the head, left hip, lower back. Denies numbness or weakness in extremities. Reports headache, photophobia, nausea. No LOC. No neck pain. Denies abdominal pain or chest pain. No SOB. denies possibility of being pregnant.   Past Medical History  Diagnosis Date  . Headache(784.0)   . Asthma   . Gastritis   . PUD (peptic ulcer disease)    History reviewed. No pertinent past surgical history. Family History  Problem Relation Age of Onset  . Bipolar disorder Sister    History  Substance Use Topics  . Smoking status: Current Some Day Smoker    Types: Cigarettes    Last Attempt to Quit: 03/08/2011  . Smokeless tobacco: Never Used  . Alcohol Use: No   OB History   Grav Para Term Preterm Abortions TAB SAB Ect Mult Living                 Review of Systems  Constitutional: Negative for fever and chills.  HENT: Negative for neck pain and neck stiffness.   Eyes: Positive for photophobia. Negative for visual disturbance.  Respiratory: Negative.   Cardiovascular: Negative.   Gastrointestinal: Negative.   Genitourinary: Negative for flank pain.  Musculoskeletal: Positive for arthralgias and gait problem.  Skin: Negative.   Neurological: Positive for headaches. Negative for dizziness, syncope, weakness, light-headedness and numbness.    Allergies  Tramadol and Penicillins  Home Medications   Current Outpatient Rx  Name  Route  Sig  Dispense  Refill  . divalproex (DEPAKOTE  ER) 250 MG 24 hr tablet   Oral   Take 1 tablet (250 mg total) by mouth 2 (two) times daily.   60 tablet   0   . ibuprofen (ADVIL,MOTRIN) 600 MG tablet   Oral   Take 1 tablet (600 mg total) by mouth every 8 (eight) hours as needed for pain.   30 tablet   0   . nabumetone (RELAFEN) 500 MG tablet   Oral   Take 1 tablet (500 mg total) by mouth 2 (two) times daily.   60 tablet   0   . sertraline (ZOLOFT) 25 MG tablet   Oral   Take 1 tablet (25 mg total) by mouth daily.   30 tablet   0   . SUMAtriptan (IMITREX) 50 MG tablet   Oral   Take 1 tablet (50 mg total) by mouth every 6 (six) hours as needed for migraine.   20 tablet   0    BP 129/61  Pulse 78  Temp(Src) 98.2 F (36.8 C)  Resp 19  SpO2 100%  LMP 10/02/2012 Physical Exam  Nursing note and vitals reviewed. Constitutional: She is oriented to person, place, and time. She appears well-developed and well-nourished. No distress.  HENT:  Head: Normocephalic and atraumatic.  Right Ear: External ear normal.  Left Ear: External ear normal.  Nose: Nose normal.  Mouth/Throat: Oropharynx is clear and moist.  TMs normal bilaterally, no hemotympanum   Eyes: Conjunctivae and EOM are normal. Pupils  are equal, round, and reactive to light.  Neck: Neck supple.  No cervical spine midline tenderness. No paravertebral tenderness. Full rom of the neck  Cardiovascular: Normal rate, regular rhythm and normal heart sounds.   Pulmonary/Chest: Effort normal and breath sounds normal. No respiratory distress. She has no wheezes. She has no rales.  Abdominal: Soft. Bowel sounds are normal. She exhibits no distension. There is no tenderness. There is no rebound.  Musculoskeletal: She exhibits no edema.  midline thoracic and lumbar spine tenderness, diffuse para vertebral tenderness. Tender in the left hip. Pain with hip flexion, extension, internal rotation. Pt is able to sit up. Knees and ankles normal bilaterally   Neurological: She is  alert and oriented to person, place, and time. No cranial nerve deficit. Coordination normal.  Skin: Skin is warm and dry.  Psychiatric: She has a normal mood and affect. Her behavior is normal.  Pt is tearful, crying    ED Course  Procedures (including critical care time) Labs Reviewed - No data to display Dg Thoracic Spine 2 View  10/02/2012   *RADIOLOGY REPORT*  Clinical Data: Fall down steps.  Pain between shoulder blades radiating anteriorly.  THORACIC SPINE - 2 VIEW  Comparison: None.  Findings: 12 rib-bearing thoracic type vertebral bodies are present.  The vertebral body heights and alignment maintained. There is some straightening of the normal thoracic kyphosis.  The visualized lung fields are clear.  The visualized portions of the ribs are unremarkable.  IMPRESSION:  1.  No acute fracture or traumatic subluxation. 2.  Straightening of the normal thoracic kyphosis.  This may be due to muscle spasm or ongoing pain.   Original Report Authenticated By: Marin Roberts, M.D.   Dg Lumbar Spine Complete  10/02/2012   *RADIOLOGY REPORT*  Clinical Data: Fall.  Low back pain extending to the left hip.  LUMBAR SPINE - COMPLETE 4+ VIEW  Comparison: None available.  Findings: Five non-rib bearing lumbar type vertebral bodies are present.  The vertebral body heights and alignment maintained. There is some straightening of the normal lumbar lordosis.  The visualized pelvis is intact.  IMPRESSION:  1.  Straightening of the normal lumbar lordosis.  This is nonspecific, but can be seen in the setting of muscle strain or ongoing pain. 2.  No acute fracture or traumatic subluxation.   Original Report Authenticated By: Marin Roberts, M.D.   Dg Hip Complete Left  10/02/2012   *RADIOLOGY REPORT*  Clinical Data: Fall.  Left hip pain.  LEFT HIP - COMPLETE 2+ VIEW  Comparison: None available.  Findings: Left hip is located.  No acute bone or soft tissue abnormalities are present.  IMPRESSION: Negative left  hip radiographs.   Original Report Authenticated By: Marin Roberts, M.D.   Medications  oxyCODONE-acetaminophen (PERCOCET/ROXICET) 5-325 MG per tablet 1 tablet (1 tablet Oral Given 10/02/12 1254)  ondansetron (ZOFRAN-ODT) disintegrating tablet 8 mg (8 mg Oral Given 10/02/12 1254)     1. Headache   2. Fall, initial encounter   3. Lumbar strain, initial encounter   4. Contusion of left hip, initial encounter     MDM  PT with pain to the back and left hip after a fall down stairs today. Her headache is typical of her migraines. i do not see any obvious signs of trauma to her head, and she has normal neurological exam. Do not think CT head is indicated at this time. Not on blood thinners. X-rays obtained and are negative as above. Pt ambulated down hallway.  Sore but able to walk without difficulty. Plan to d/c home with follow up outpatient. Offered medications for her migraine, but pt stated "the only thing that helps me is marijuana." percocet and zofran given for her symptoms. Pt neurovascularly intact.   Filed Vitals:   10/02/12 1215  BP: 129/61  Pulse: 78  Temp: 98.2 F (36.8 C)  Resp: 19  SpO2: 100%     Onelia Cadmus A Marcellius Montagna, PA-C 10/02/12 1403

## 2012-10-02 NOTE — ED Notes (Signed)
Patient states she fell down "about 20 steps".   She claims she just "slipped because carpet was being cleaned and my feet got wet".

## 2012-10-03 NOTE — ED Provider Notes (Signed)
Medical screening examination/treatment/procedure(s) were performed by non-physician practitioner and as supervising physician I was immediately available for consultation/collaboration.   Malissa Slay III, MD 10/03/12 1740 

## 2013-10-13 ENCOUNTER — Encounter (HOSPITAL_COMMUNITY): Payer: Self-pay | Admitting: Emergency Medicine

## 2013-10-13 ENCOUNTER — Emergency Department (HOSPITAL_COMMUNITY)
Admission: EM | Admit: 2013-10-13 | Discharge: 2013-10-13 | Disposition: A | Discharge status: Home / Self Care | Payer: Self-pay | Attending: Emergency Medicine | Admitting: Emergency Medicine

## 2013-10-13 DIAGNOSIS — J45909 Unspecified asthma, uncomplicated: Secondary | ICD-10-CM | POA: Insufficient documentation

## 2013-10-13 DIAGNOSIS — F4310 Post-traumatic stress disorder, unspecified: Secondary | ICD-10-CM | POA: Insufficient documentation

## 2013-10-13 DIAGNOSIS — Z8711 Personal history of peptic ulcer disease: Secondary | ICD-10-CM | POA: Insufficient documentation

## 2013-10-13 DIAGNOSIS — Z8719 Personal history of other diseases of the digestive system: Secondary | ICD-10-CM | POA: Insufficient documentation

## 2013-10-13 DIAGNOSIS — F17200 Nicotine dependence, unspecified, uncomplicated: Secondary | ICD-10-CM | POA: Insufficient documentation

## 2013-10-13 DIAGNOSIS — K029 Dental caries, unspecified: Secondary | ICD-10-CM | POA: Insufficient documentation

## 2013-10-13 DIAGNOSIS — Z880 Allergy status to penicillin: Secondary | ICD-10-CM | POA: Insufficient documentation

## 2013-10-13 DIAGNOSIS — F411 Generalized anxiety disorder: Secondary | ICD-10-CM | POA: Insufficient documentation

## 2013-10-13 DIAGNOSIS — Z79899 Other long term (current) drug therapy: Secondary | ICD-10-CM | POA: Insufficient documentation

## 2013-10-13 HISTORY — DX: Post-traumatic stress disorder, unspecified: F43.10

## 2013-10-13 HISTORY — DX: Anxiety disorder, unspecified: F41.9

## 2013-10-13 MED ORDER — HYDROCODONE-ACETAMINOPHEN 7.5-325 MG/15ML PO SOLN: 10.0000 mL | ORAL | Status: DC | PRN

## 2013-10-13 MED ORDER — ERYTHROMYCIN BASE 250 MG PO TABS: 250.0000 mg | ORAL_TABLET | Freq: Four times a day (QID) | ORAL | Status: DC

## 2013-10-13 MED ORDER — OXYCODONE-ACETAMINOPHEN 5-325 MG PO TABS
1.0000 | ORAL_TABLET | Freq: Once | ORAL | Status: AC
  Administered 2013-10-13: 1 via ORAL
  Filled 2013-10-13: qty 1

## 2013-10-13 MED ORDER — ERYTHROMYCIN BASE 250 MG PO TBEC
250.0000 mg | DELAYED_RELEASE_TABLET | Freq: Once | ORAL | Status: AC
  Administered 2013-10-13: 250 mg via ORAL
  Filled 2013-10-13: qty 1

## 2013-10-13 NOTE — Discharge Instructions (Signed)
Follow up with a dentist for continued evaluation and treatment.   Dental Caries  Dental caries (also called tooth decay) is the most common oral disease. It can occur at any age, but is more common in children and young adults.  HOW DENTAL CARIES DEVELOPS  The process of decay begins when bacteria and foods (particularly sugars and starches) combine in your mouth to produce plaque. Plaque is a substance that sticks to the hard, outer surface of a tooth (enamel). The bacteria in plaque produce acids that attack enamel. These acids may also attack the root surface of a tooth (cementum) if it is exposed. Repeated attacks dissolve these surfaces and create holes in the tooth (cavities). If left untreated, the acids destroy the other layers of the tooth.  RISK FACTORS  Frequent sipping of sugary beverages.   Frequent snacking on sugary and starchy foods, especially those that easily get stuck in the teeth.   Poor oral hygiene.   Dry mouth.   Substance abuse such as methamphetamine abuse.   Broken or poor-fitting dental restorations.   Eating disorders.   Gastroesophageal reflux disease (GERD).   Certain radiation treatments to the head and neck. SYMPTOMS In the early stages of dental caries, symptoms are seldom present. Sometimes white, chalky areas may be seen on the enamel or other tooth layers. In later stages, symptoms may include:  Pits and holes on the enamel.  Toothache after sweet, hot, or cold foods or drinks are consumed.  Pain around the tooth.  Swelling around the tooth. DIAGNOSIS  Most of the time, dental caries is detected during a regular dental checkup. A diagnosis is made after a thorough medical and dental history is taken and the surfaces of your teeth are checked for signs of dental caries. Sometimes special instruments, such as lasers, are used to check for dental caries. Dental X-ray exams may be taken so that areas not visible to the eye (such as  between the contact areas of the teeth) can be checked for cavities.  TREATMENT  If dental caries is in its early stages, it may be reversed with a fluoride treatment or an application of a remineralizing agent at the dental office. Thorough brushing and flossing at home is needed to aid these treatments. If it is in its later stages, treatment depends on the location and extent of tooth destruction:   If a small area of the tooth has been destroyed, the destroyed area will be removed and cavities will be filled with a material such as gold, silver amalgam, or composite resin.   If a large area of the tooth has been destroyed, the destroyed area will be removed and a cap (crown) will be fitted over the remaining tooth structure.   If the center part of the tooth (pulp) is affected, a procedure called a root canal will be needed before a filling or crown can be placed.   If most of the tooth has been destroyed, the tooth may need to be pulled (extracted). HOME CARE INSTRUCTIONS You can prevent, stop, or reverse dental caries at home by practicing good oral hygiene. Good oral hygiene includes:  Thoroughly cleaning your teeth at least twice a day with a toothbrush and dental floss.   Using a fluoride toothpaste. A fluoride mouth rinse may also be used if recommended by your dentist or health care provider.   Restricting the amount of sugary and starchy foods and sugary liquids you consume.   Avoiding frequent snacking on these  foods and sipping of these liquids.   Keeping regular visits with a dentist for checkups and cleanings. PREVENTION   Practice good oral hygiene.  Consider a dental sealant. A dental sealant is a coating material that is applied by your dentist to the pits and grooves of teeth. The sealant prevents food from being trapped in them. It may protect the teeth for several years.  Ask about fluoride supplements if you live in a community without fluorinated water or  with water that has a low fluoride content. Use fluoride supplements as directed by your dentist or health care provider.  Allow fluoride varnish applications to teeth if directed by your dentist or health care provider. Document Released: 12/03/2001 Document Revised: 11/13/2012 Document Reviewed: 03/15/2012 [facility]Exit[facility] Patient Information 2015 [place], [place]. This information is not intended to replace advice given to you by your health care provider. Make sure you discuss any questions you have with your health care provider.    Dental Pain A tooth ache may be caused by cavities (tooth decay). Cavities expose the nerve of the tooth to air and hot or cold temperatures. It may come from an infection or abscess (also called a boil or furuncle) around your tooth. It is also often caused by dental caries (tooth decay). This causes the pain you are having. DIAGNOSIS  Your caregiver can diagnose this problem by exam. TREATMENT   If caused by an infection, it may be treated with medications which kill germs (antibiotics) and pain medications as prescribed by your caregiver. Take medications as directed.  Only take over-the-counter or prescription medicines for pain, discomfort, or fever as directed by your caregiver.  Whether the tooth ache today is caused by infection or dental disease, you should see your dentist as soon as possible for further care. SEEK MEDICAL CARE IF: The exam and treatment you received today has been provided on an emergency basis only. This is not a substitute for complete medical or dental care. If your problem worsens or new problems (symptoms) appear, and you are unable to meet with your dentist, call or return to this location. SEEK IMMEDIATE MEDICAL CARE IF:   You have a fever.  You develop redness and swelling of your face, jaw, or neck.  You are unable to open your mouth.  You have severe pain uncontrolled by pain medicine. MAKE SURE YOU:   Understand these  instructions.  Will watch your condition.  Will get help right away if you are not doing well or get worse. Document Released: 03/13/2005 Document Revised: 06/05/2011 Document Reviewed: 10/30/2007 [facility] CityExitCare Patient Information 2015 [place], [place]. This information is not intended to replace advice given to you by your health care provider. Make sure you discuss any questions you have with your health care provider.

## 2013-10-13 NOTE — ED Notes (Signed)
Notified pharmacy to send pt medications
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
## 2013-10-13 NOTE — ED Provider Notes (Signed)
Medical screening examination/treatment/procedure(s) were performed by non-physician practitioner and as supervising physician I was immediately available for consultation/collaboration.    Nelia Shiobert L Keldrick Pomplun, MD 10/13/13 [id number]

## 2014-06-22 ENCOUNTER — Encounter (HOSPITAL_COMMUNITY): Payer: Self-pay

## 2014-06-22 ENCOUNTER — Emergency Department (HOSPITAL_COMMUNITY)
Admission: EM | Admit: 2014-06-22 | Discharge: 2014-06-22 | Disposition: A | Discharge status: Home / Self Care | Payer: No Typology Code available for payment source | Source: Home / Self Care | Attending: Emergency Medicine | Admitting: Emergency Medicine

## 2014-06-22 DIAGNOSIS — H9191 Unspecified hearing loss, right ear: Secondary | ICD-10-CM

## 2014-06-22 DIAGNOSIS — H6121 Impacted cerumen, right ear: Secondary | ICD-10-CM

## 2014-06-22 NOTE — ED Notes (Signed)
Patient states she is having some hearing loss to her right ear The area around her ear feels numb and she is having some ringing in the ear Patient did state she suffer a head injury a couple years ago working as a [employer] All initial test came back negative but the problem is getting progressively worse

## 2014-06-22 NOTE — Discharge Instructions (Signed)
Cerumen Impaction A cerumen impaction is when the wax in your ear forms a plug. This plug usually causes reduced hearing. Sometimes it also causes an earache or dizziness. Removing a cerumen impaction can be difficult and painful. The wax sticks to the ear canal. The canal is sensitive and bleeds easily. If you try to remove a heavy wax buildup with a cotton tipped swab, you may push it in further. Irrigation with water, suction, and small ear curettes may be used to clear out the wax. If the impaction is fixed to the skin in the ear canal, ear drops may be needed for a few days to loosen the wax. People who build up a lot of wax frequently can use ear wax removal products available in your local drugstore. SEEK MEDICAL CARE IF:  You develop an earache, increased hearing loss, or marked dizziness. Document Released: 04/20/2004 Document Revised: 06/05/2011 Document Reviewed: 06/10/2009 [facility]Exit[facility] Patient Information 2015 [place]ExitCare, [place]LLC. This information is not intended to replace advice given to you by your health care provider. Make sure you discuss any questions you have with your health care provider. Hearing Loss A hearing loss is sometimes called deafness. Hearing loss may be partial or total. CAUSES Hearing loss may be caused by:  Wax in the ear canal.  Infection of the ear canal.  Infection of the middle ear.  Trauma to the ear or surrounding area.  Fluid in the middle ear.  A hole in the eardrum (perforated eardrum).  Exposure to loud sounds or music.  Problems with the hearing nerve.  Certain medications. Hearing loss without wax, infection, or a history of injury may mean that the nerve is involved. Hearing loss with severe dizziness, nausea and vomiting or ringing in the ear may suggest a hearing nerve irritation or problems in the middle or inner ear. If hearing loss is untreated, there is a greater likelihood for residual or permanent hearing loss. DIAGNOSIS A hearing test  (audiometry) assesses hearing loss. The audiometry test needs to be performed by a hearing specialist (audiologist). TREATMENT Treatment for recent onset of hearing loss may include:  Ear wax removal.  Medications that kill germs (antibiotics).  Cortisone medications.  Prompt follow up with the appropriate specialist. Return of hearing depends on the cause of your hearing loss, so proper medical follow-up is important. Some hearing loss may not be reversible, and a caregiver should discuss care and treatment options with you. SEEK MEDICAL CARE IF:   You have a severe headache, dizziness, or changes in vision.  You have new or increased weakness.  You develop repeated vomiting or other serious medical problems.  You have a fever. Document Released: 03/13/2005 Document Revised: 06/05/2011 Document Reviewed: 07/08/2009 [facility]ExitCare Patient Information 2015 [place]ExitCare, [place]LLC. This information is not intended to replace advice given to you by your health care provider. Make sure you discuss any questions you have with your health care provider.
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
## 2014-06-24 ENCOUNTER — Encounter (HOSPITAL_COMMUNITY): Payer: Self-pay | Admitting: Emergency Medicine

## 2014-06-24 ENCOUNTER — Emergency Department (HOSPITAL_COMMUNITY)
Admission: EM | Admit: 2014-06-24 | Discharge: 2014-06-24 | Disposition: A | Discharge status: Home / Self Care | Payer: No Typology Code available for payment source | Attending: Emergency Medicine | Admitting: Emergency Medicine

## 2014-06-24 DIAGNOSIS — J45909 Unspecified asthma, uncomplicated: Secondary | ICD-10-CM | POA: Insufficient documentation

## 2014-06-24 DIAGNOSIS — Z79899 Other long term (current) drug therapy: Secondary | ICD-10-CM | POA: Insufficient documentation

## 2014-06-24 DIAGNOSIS — H9201 Otalgia, right ear: Secondary | ICD-10-CM | POA: Insufficient documentation

## 2014-06-24 DIAGNOSIS — H9191 Unspecified hearing loss, right ear: Secondary | ICD-10-CM | POA: Insufficient documentation

## 2014-06-24 DIAGNOSIS — F419 Anxiety disorder, unspecified: Secondary | ICD-10-CM | POA: Insufficient documentation

## 2014-06-24 DIAGNOSIS — Z8719 Personal history of other diseases of the digestive system: Secondary | ICD-10-CM | POA: Insufficient documentation

## 2014-06-24 DIAGNOSIS — Z8711 Personal history of peptic ulcer disease: Secondary | ICD-10-CM | POA: Insufficient documentation

## 2014-06-24 DIAGNOSIS — R519 Headache, unspecified: Secondary | ICD-10-CM

## 2014-06-24 DIAGNOSIS — H9313 Tinnitus, bilateral: Secondary | ICD-10-CM | POA: Insufficient documentation

## 2014-06-24 DIAGNOSIS — F4310 Post-traumatic stress disorder, unspecified: Secondary | ICD-10-CM | POA: Insufficient documentation

## 2014-06-24 DIAGNOSIS — R51 Headache: Secondary | ICD-10-CM | POA: Insufficient documentation

## 2014-06-24 DIAGNOSIS — Z880 Allergy status to penicillin: Secondary | ICD-10-CM | POA: Insufficient documentation

## 2014-06-24 DIAGNOSIS — Z720 Tobacco use: Secondary | ICD-10-CM | POA: Insufficient documentation

## 2014-06-24 DIAGNOSIS — G8929 Other chronic pain: Secondary | ICD-10-CM | POA: Insufficient documentation

## 2014-06-24 NOTE — ED Notes (Signed)
Pt presents with decreased hearing to right ear- pt reports numbness around right ear.  [facility] and [facility] ear [facility] but is still having symptoms.
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
## 2014-07-03 ENCOUNTER — Emergency Department (HOSPITAL_COMMUNITY)
Admission: EM | Admit: 2014-07-03 | Discharge: 2014-07-04 | Disposition: A | Discharge status: Other Acute Inpatient Hospital | Payer: No Typology Code available for payment source | Attending: Emergency Medicine | Admitting: Emergency Medicine

## 2014-07-03 ENCOUNTER — Encounter (HOSPITAL_COMMUNITY): Payer: Self-pay

## 2014-07-03 DIAGNOSIS — F32A Depression, unspecified: Secondary | ICD-10-CM

## 2014-07-03 DIAGNOSIS — F1210 Cannabis abuse, uncomplicated: Secondary | ICD-10-CM | POA: Insufficient documentation

## 2014-07-03 DIAGNOSIS — Z880 Allergy status to penicillin: Secondary | ICD-10-CM | POA: Insufficient documentation

## 2014-07-03 DIAGNOSIS — F419 Anxiety disorder, unspecified: Secondary | ICD-10-CM | POA: Insufficient documentation

## 2014-07-03 DIAGNOSIS — T1491XA Suicide attempt, initial encounter: Secondary | ICD-10-CM

## 2014-07-03 DIAGNOSIS — J45909 Unspecified asthma, uncomplicated: Secondary | ICD-10-CM | POA: Insufficient documentation

## 2014-07-03 DIAGNOSIS — Z8711 Personal history of peptic ulcer disease: Secondary | ICD-10-CM | POA: Insufficient documentation

## 2014-07-03 DIAGNOSIS — Z8719 Personal history of other diseases of the digestive system: Secondary | ICD-10-CM | POA: Insufficient documentation

## 2014-07-03 DIAGNOSIS — F329 Major depressive disorder, single episode, unspecified: Secondary | ICD-10-CM | POA: Insufficient documentation

## 2014-07-03 DIAGNOSIS — G4700 Insomnia, unspecified: Secondary | ICD-10-CM | POA: Insufficient documentation

## 2014-07-03 LAB — COMPREHENSIVE METABOLIC PANEL
ALBUMIN: 4.9 g/dL (ref 3.5–5.2)
ALT: 11 U/L (ref 0–35)
ANION GAP: 12 (ref 5–15)
AST: 21 U/L (ref 0–37)
Alkaline Phosphatase: 49 U/L (ref 39–117)
BILIRUBIN TOTAL: 0.9 mg/dL (ref 0.3–1.2)
BUN: 14 mg/dL (ref 6–23)
CALCIUM: 9.3 mg/dL (ref 8.4–10.5)
CO2: 22 mmol/L (ref 19–32)
CREATININE: 0.65 mg/dL (ref 0.50–1.10)
Chloride: 107 mmol/L (ref 96–112)
GFR calc Af Amer: >90 mL/min (ref >90)
GFR calc non Af Amer: >90 mL/min (ref >90)
Glucose, Bld: 82 mg/dL (ref 70–99)
Potassium: 3.2 mmol/L — ABNORMAL LOW (ref 3.5–5.1)
SODIUM: 141 mmol/L (ref 135–145)
TOTAL PROTEIN: 8.3 g/dL (ref 6.0–8.3)

## 2014-07-03 LAB — ETHANOL: Alcohol, Ethyl (B): <5 mg/dL (ref 0–9)

## 2014-07-03 LAB — RAPID URINE DRUG SCREEN, HOSP PERFORMED
AMPHETAMINES: NOT DETECTED
BENZODIAZEPINES: NOT DETECTED
Barbiturates: NOT DETECTED
COCAINE: NOT DETECTED
OPIATES: NOT DETECTED
TETRAHYDROCANNABINOL: POSITIVE — AB

## 2014-07-03 LAB — CBC
HCT: 38.6 % (ref 36.0–46.0)
Hemoglobin: 12.2 g/dL (ref 12.0–15.0)
MCH: 28.2 pg (ref 26.0–34.0)
MCHC: 31.6 g/dL (ref 30.0–36.0)
MCV: 89.1 fL (ref 78.0–100.0)
Platelets: 297 10*3/uL (ref 150–400)
RBC: 4.33 MIL/uL (ref 3.87–5.11)
RDW: 12.9 % (ref 11.5–15.5)
WBC: 5.5 10*3/uL (ref 4.0–10.5)

## 2014-07-03 LAB — SALICYLATE LEVEL: Salicylate Lvl: <4 mg/dL (ref 2.8–20.0)

## 2014-07-03 LAB — ACETAMINOPHEN LEVEL: Acetaminophen (Tylenol), Serum: <10 ug/mL — ABNORMAL LOW (ref 10–30)

## 2014-07-03 MED ORDER — ZOLPIDEM TARTRATE 5 MG PO TABS
5.0000 mg | ORAL_TABLET | Freq: Every evening | ORAL | Status: DC | PRN
  Administered 2014-07-03: 5 mg via ORAL
  Filled 2014-07-03: qty 1

## 2014-07-03 MED ORDER — ONDANSETRON HCL 4 MG PO TABS: 4.0000 mg | ORAL_TABLET | Freq: Three times a day (TID) | ORAL | Status: DC | PRN

## 2014-07-03 MED ORDER — POTASSIUM CHLORIDE CRYS ER 20 MEQ PO TBCR
40.0000 meq | EXTENDED_RELEASE_TABLET | Freq: Once | ORAL | Status: AC
  Administered 2014-07-03: 40 meq via ORAL
  Filled 2014-07-03: qty 2

## 2014-07-03 MED ORDER — ACETAMINOPHEN 325 MG PO TABS
650.0000 mg | ORAL_TABLET | ORAL | Status: DC | PRN
  Administered 2014-07-03: 650 mg via ORAL
  Filled 2014-07-03: qty 2

## 2014-07-03 MED ORDER — ALUM & MAG HYDROXIDE-SIMETH 200-200-20 MG/5ML PO SUSP: 30.0000 mL | ORAL | Status: DC | PRN

## 2014-07-03 NOTE — ED Notes (Signed)
Pt. Noted in room. No complaints or concerns voiced. No distress or abnormal behavior noted. Will continue to monitor with security cameras. Q 15 minute rounds continue. 

## 2014-07-03 NOTE — ED Notes (Signed)
Bed: [facility] Expected date:  Expected time:  Means of arrival:  Comments: suicidal 
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
## 2014-07-03 NOTE — ED Notes (Signed)
Pt. To [facility] from ED ambulatory without difficulty, to room 35 . Report from Beazer HomesFrances RN. Pt. Is alert and oriented, warm and dry in no distress. Pt. Denies SI, HI, and AVH. Pt. Calm and cooperative. Pt. Made aware of security cameras and Q15 minute rounds. Pt. Encouraged to let Nursing staff know of any concerns or needs.

## 2014-07-03 NOTE — ED Notes (Signed)
Pt. Noted sleeping in room. No complaints or concerns voiced. No distress or abnormal behavior noted. Will continue to monitor with security cameras. Q 15 minute rounds continue. 
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
## 2014-07-03 NOTE — BH Assessment (Signed)
[facility] to the services of Dr. Elna BreslowEappen. Dr. Micheline Mazeocherty [facility].

## 2014-07-03 NOTE — BH Assessment (Signed)
Assessment completed. Consulted Birdena JubileeIjeoma Nwaze, NP who recommended inpatient criteria. Dr. Micheline Mazeocherty has been informed of the recommendation. TTS will contact other facilities for placement.

## 2014-07-04 ENCOUNTER — Inpatient Hospital Stay (HOSPITAL_COMMUNITY)
Admission: EM | Admit: 2014-07-04 | Discharge: 2014-07-11 | DRG: 885 | Disposition: A | Discharge status: Home / Self Care | Payer: Federal, State, Local not specified - Other | Source: Intra-hospital | Attending: Psychiatry | Admitting: Psychiatry

## 2014-07-04 ENCOUNTER — Encounter (HOSPITAL_COMMUNITY): Payer: Self-pay

## 2014-07-04 DIAGNOSIS — F603 Borderline personality disorder: Secondary | ICD-10-CM | POA: Diagnosis not present

## 2014-07-04 DIAGNOSIS — G4700 Insomnia, unspecified: Secondary | ICD-10-CM | POA: Diagnosis present

## 2014-07-04 DIAGNOSIS — F1210 Cannabis abuse, uncomplicated: Secondary | ICD-10-CM | POA: Diagnosis present

## 2014-07-04 DIAGNOSIS — G43709 Chronic migraine without aura, not intractable, without status migrainosus: Secondary | ICD-10-CM | POA: Diagnosis present

## 2014-07-04 DIAGNOSIS — G43909 Migraine, unspecified, not intractable, without status migrainosus: Secondary | ICD-10-CM | POA: Diagnosis present

## 2014-07-04 DIAGNOSIS — F332 Major depressive disorder, recurrent severe without psychotic features: Principal | ICD-10-CM | POA: Diagnosis present

## 2014-07-04 DIAGNOSIS — F333 Major depressive disorder, recurrent, severe with psychotic symptoms: Secondary | ICD-10-CM

## 2014-07-04 DIAGNOSIS — F1220 Cannabis dependence, uncomplicated: Secondary | ICD-10-CM | POA: Diagnosis present

## 2014-07-04 DIAGNOSIS — J45909 Unspecified asthma, uncomplicated: Secondary | ICD-10-CM | POA: Diagnosis present

## 2014-07-04 DIAGNOSIS — F4310 Post-traumatic stress disorder, unspecified: Secondary | ICD-10-CM | POA: Diagnosis not present

## 2014-07-04 DIAGNOSIS — F411 Generalized anxiety disorder: Secondary | ICD-10-CM | POA: Diagnosis present

## 2014-07-04 DIAGNOSIS — Z599 Problem related to housing and economic circumstances, unspecified: Secondary | ICD-10-CM | POA: Diagnosis not present

## 2014-07-04 DIAGNOSIS — F17210 Nicotine dependence, cigarettes, uncomplicated: Secondary | ICD-10-CM | POA: Diagnosis present

## 2014-07-04 DIAGNOSIS — Z62810 Personal history of physical and sexual abuse in childhood: Secondary | ICD-10-CM | POA: Diagnosis present

## 2014-07-04 MED ORDER — ENSURE ENLIVE PO LIQD
237.0000 mL | Freq: Two times a day (BID) | ORAL | Status: DC
  Administered 2014-07-04 – 2014-07-11 (×14): 237 mL via ORAL

## 2014-07-04 MED ORDER — HYDROXYZINE HCL 25 MG PO TABS
25.0000 mg | ORAL_TABLET | Freq: Three times a day (TID) | ORAL | Status: DC | PRN
  Administered 2014-07-06 – 2014-07-08 (×2): 25 mg via ORAL
  Filled 2014-07-04: qty 20
  Filled 2014-07-04 (×2): qty 1

## 2014-07-04 MED ORDER — CITALOPRAM HYDROBROMIDE 10 MG PO TABS
10.0000 mg | ORAL_TABLET | Freq: Every day | ORAL | Status: DC
  Administered 2014-07-04 – 2014-07-06 (×3): 10 mg via ORAL
  Filled 2014-07-04 (×6): qty 1

## 2014-07-04 MED ORDER — ACETAMINOPHEN 325 MG PO TABS: 650.0000 mg | ORAL_TABLET | Freq: Four times a day (QID) | ORAL | Status: DC | PRN

## 2014-07-04 MED ORDER — ALUM & MAG HYDROXIDE-SIMETH 200-200-20 MG/5ML PO SUSP
30.0000 mL | ORAL | Status: DC | PRN
  Administered 2014-07-04: 30 mL via ORAL
  Filled 2014-07-04: qty 30

## 2014-07-04 MED ORDER — MAGNESIUM HYDROXIDE 400 MG/5ML PO SUSP: 30.0000 mL | Freq: Every day | ORAL | Status: DC | PRN

## 2014-07-04 MED ORDER — TRAZODONE HCL 100 MG PO TABS
100.0000 mg | ORAL_TABLET | Freq: Every evening | ORAL | Status: DC | PRN
  Administered 2014-07-04 – 2014-07-06 (×2): 100 mg via ORAL
  Filled 2014-07-04 (×2): qty 1

## 2014-07-04 NOTE — [facility] Group Notes (Signed)
[facility] Group Notes:  (Clinical Social Work)  07/04/2014  11:15-12:00PM  Summary of Progress/Problems:   The main focus of today's process group was to discuss patients' feelings about hospitalization, the stigma attached to mental health, and sources of motivation to stay well.  We then worked to identify a specific plan to avoid future hospitalizations when discharged from the hospital for this admission.  The patient expressed little during group, refused to talk, stated she has no feelings about being in the hospital, has no hopes as to what to get out of being in the hospital.  Type of Therapy:  Group Therapy - Process  Participation Level:  Minimal  Participation Quality:  Resistant  Affect:  Flat  Cognitive:  Alert  Insight:  None  Engagement in Therapy:  Limited  Modes of Intervention:  Exploration, Discussion  Ambrose MantleMareida Grossman-Orr, LCSW 07/04/2014, 12:58 PM

## 2014-07-04 NOTE — [facility] Suicide Risk Assessment (Signed)
[facility]   Nursing information obtained from:    Demographic factors:    Current Mental Status:    Loss Factors:    Historical Factors:    Risk Reduction Factors:    Total Time spent with patient: 1 hour Principal Problem: <principal problem not specified> Diagnosis:   Patient Active Problem List   Diagnosis Date Noted  . Major depressive disorder, recurrent, severe with psychotic features [F33.3] 07/04/2014  . Generalized anxiety disorder [F41.1] 07/04/2014  . Major depressive disorder, recurrent episode, moderate [F33.1] 07/25/2012  . Syncope [R55] 04/03/2011  . Headache [R51] 04/03/2011     Continued Clinical Symptoms:  Alcohol Use Disorder Identification Test Final Score (AUDIT): 3 The "Alcohol Use Disorders Identification Test", Guidelines for Use in Primary Care, Second Edition.  World Science writerHealth Organization [facility](WHO). Score between 0-7:  no or low risk or alcohol related problems. Score between 8-15:  moderate risk of alcohol related problems. Score between 16-19:  high risk of alcohol related problems. Score 20 or above:  warrants further diagnostic evaluation for alcohol dependence and treatment.   CLINICAL FACTORS:   Dysthymia More than one psychiatric diagnosis Unstable or Poor Therapeutic Relationship Previous Psychiatric Diagnoses and Treatments   Musculoskeletal: Strength & Muscle Tone: within normal limits Gait & Station: normal Patient leans: N/A  Psychiatric Specialty Exam: Physical Exam  Constitutional: She appears well-developed.  HENT:  Head: Normocephalic and atraumatic.    Review of Systems  Constitutional: Negative.   Cardiovascular: Negative for chest pain.  Psychiatric/Behavioral: Positive for depression. The patient is nervous/anxious.     Blood pressure 130/84, pulse 94, temperature 98.5 F (36.9 C), temperature source Oral, resp. rate 18, height 5' 5.5" (1.664 m), weight 64.864 kg (143 lb), last menstrual period  07/02/2014.Body mass index is 23.43 kg/(m^2).  General Appearance: Casual  Eye Contact::  Fair  Speech:  Slow  Volume:  Normal  Mood:  Dysphoric  Affect:  Constricted  Thought Process:  Coherent  Orientation:  Full (Time, Place, and Person)  Thought Content:  Rumination  Suicidal Thoughts:  No  Homicidal Thoughts:  No  Memory:  Immediate;   Fair Recent;   Fair  Judgement:  Poor  Insight:  Shallow  Psychomotor Activity:  Normal  Concentration:  Fair  Recall:  FiservFair  Fund of Knowledge:Fair  Language: Fair  Akathisia:  Negative  Handed:  Right  AIMS (if indicated):     Assets:  Desire for Improvement Vocational/Educational  Sleep:     Cognition: WNL  ADL's:  Intact     COGNITIVE FEATURES THAT CONTRIBUTE TO RISK:  Closed-mindedness and Polarized thinking    SUICIDE RISK:   Moderate:  Frequent suicidal ideation with limited intensity, and duration, some specificity in terms of plans, no associated intent, good self-control, limited dysphoria/symptomatology, some risk factors present, and identifiable protective factors, including available and accessible social support.  PLAN OF CARE: Admit to stabilize for depression, PTSD and impulsivity. Support groups  Medical Decision Making:  New problem, with additional work up planned, Review of Psycho-Social Stressors (1), Established Problem, Worsening (2), Review of Last Therapy Session (1) and Review of Medication Regimen & Side Effects (2)  I certify that inpatient services furnished can reasonably be expected to improve the patient's condition.   Kirston Luty 07/04/2014, 12:21 PM
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
## 2014-07-04 NOTE — Progress Notes (Signed)
D Pt. Denies SI and HI, no complaints of pain or discomfort noted at this time.  A Writer offered support and encouragement, discussed pt.'s  Day, family and pt.'s girlfriend.  R Pt. Expressed her thoughts and feelings to her family today, and continues to appear depressed and anxious. Pt. Reports she may no longer have contact with her family. Pt. Does reside with her girlfriend and they have resolved their issues and pt. Will return to the previous home.  Pt. States she was upset with her family and took it out on the girlfriend.  Pt.  Does not appear vested in treatment at this time.  Minimal interaction, wanting to isolate in her room.  Pt. Remains safe on the unit.

## 2014-07-04 NOTE — Progress Notes (Signed)
Adult Psychoeducational Group Note  Date:  07/04/2014 Time:  8:46 PM  Group Topic/Focus:  Wrap-Up Group:   The focus of this group is to help patients review their daily goal of treatment and discuss progress on daily workbooks.  Participation Level:  Minimal  Participation Quality:  Resistant  Affect:  Flat  Cognitive:  Appropriate  Insight: Lacking  Engagement in Group:  Lacking  Modes of Intervention:  Discussion  Additional Comments:The patient seem flat and irritated in group.The patient only concern was receiving her  meds for sleep.  Octavio Mannshigpen, Clemence Lengyel Lee 07/04/2014, 8:46 PM

## 2014-07-04 NOTE — ED Notes (Signed)
Pt. Noted sleeping in room. No complaints or concerns voiced. No distress or abnormal behavior noted. Will continue to monitor with security cameras. Q 15 minute rounds continue. 
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
## 2014-07-04 NOTE — Progress Notes (Signed)
[facility] Group Notes:  (Nursing/MHT/Case Management/Adjunct)  Date:  07/04/2014  Time:  10:46 AM  Type of Therapy:  Music Therapy  Participation Level:  Did Not Attend  Participation Quality:    Affect:    Cognitive:    Insight:    Engagement in Group:    Modes of Intervention:    Summary of Progress/Problems:  Rich BraveDuke, Ninamarie Keel Lynn 07/04/2014, 10:46 AM

## 2014-07-04 NOTE — Progress Notes (Addendum)
D) Affect and mood depressed.  Pt. 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  Pt. Reports her sister's kids are have "autism" and ADHD and require special educational attention.  Pt. Reports she has "migraine headaches" and states she gets "no relief" from imitrex or anything else the neurologist has given her.  Pt. Defensively explained her use of marijuana as occasional and for the purpose of pain relief.  Pt. Stated "I don't want people to think I'm a pot smoking nothing".  Pt. Reports that if she could get her "pain under control", she could pursue her education or a job.  Pt. States she has recurrent nightmares from past traumas and feels her family does not understand her "mental issues, or headaches". Pt. Declined any intervention for her HA today, despite stating later in the day that her pain was a "9/10". A)Medication education done regarding start of Celexa.  Emotional support and 1:1 opportunity to share concerns offered.  R) Pt. Verbalized appreciation for care, but was noted tearing at her coloring project in frustration.

## 2014-07-04 NOTE — Tx Team (Signed)
Initial Interdisciplinary Treatment Plan   PATIENT STRESSORS: Financial difficulties Marital or family conflict Medication change or noncompliance Traumatic event   PATIENT STRENGTHS: Ability for insight General fund of knowledge Motivation for treatment/growth Supportive family/friends   PROBLEM LIST: Problem List/Patient Goals Date to be addressed Date deferred Reason deferred Estimated date of resolution  Risk for suicide 07/04/14     psychosis 07/04/14     depression 07/04/14     relationship 07/04/14     "I don't even know what I want to work on" 07/04/14                              DISCHARGE CRITERIA:  Ability to meet basic life and health needs Improved stabilization in mood, thinking, and/or behavior Need for constant or close observation no longer present Verbal commitment to aftercare and medication compliance  PRELIMINARY DISCHARGE PLAN: Attend PHP/IOP Outpatient therapy  PATIENT/FAMIILY INVOLVEMENT: This treatment plan has been presented to and reviewed with the patient, [name].  The patient and family have been given the opportunity to ask questions and make suggestions.  Jacques Navyhillips, Leanndra Pember A 07/04/2014, 3:25 AM
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
## 2014-07-05 NOTE — Progress Notes (Signed)
NUTRITION ASSESSMENT  Pt identified as at risk on the Malnutrition Screen Tool  INTERVENTION: 1. Supplements: Ensure Enlive po BID, each supplement provides 350 kcal and 20 grams of protein 2. Encourage PO intake  NUTRITION DIAGNOSIS: Unintentional weight loss related to sub-optimal intake as evidenced by pt report.   Goal: Pt to meet >/= 90% of their estimated nutrition needs.  Monitor:  PO intake  Assessment:  Pt admitted with depression and anxiety.  Per weight history, pt has lost 13 lb since 3/30 (8% weight loss x 1.5 weeks, significant for time frame). Per H&P, pt has had poor appetite PTA. Pt is receiving Ensure supplements.  Height: Ht Readings from Last 1 Encounters:  07/04/14 5' 5.5" (1.664 m)    Weight: Wt Readings from Last 1 Encounters:  07/04/14 143 lb (64.864 kg)    Weight Hx: Wt Readings from Last 10 Encounters:  07/04/14 143 lb (64.864 kg)  06/24/14 156 lb (70.761 kg)  10/13/13 156 lb 3 oz (70.846 kg)  09/06/12 164 lb (74.39 kg)  04/03/11 181 lb (82.101 kg)    BMI:  Body mass index is 23.43 kg/(m^2). Pt meets criteria for normal range based on current BMI.  Estimated Nutritional Needs: Kcal: 25-30 kcal/kg Protein: > 1 gram protein/kg Fluid: 1 ml/kcal  Diet Order: Diet regular Room service appropriate?: Yes; Fluid consistency:: Thin Pt is also offered choice of unit snacks mid-morning and mid-afternoon.  Pt is eating as desired.   Lab results and medications reviewed.   Tilda FrancoLindsey Deonna Krummel, MS, RD, LDN Pager: [phone] After Hours Pager: [phone]
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
## 2014-07-05 NOTE — Progress Notes (Signed)
Adult Psychoeducational Group Note  Date:  07/05/2014 Time:  8:29 PM  Group Topic/Focus:  Wrap-Up Group:   The focus of this group is to help patients review their daily goal of treatment and discuss progress on daily workbooks.  Participation Level:  Active  Participation Quality:  Appropriate  Affect:  Appropriate  Cognitive:  Appropriate  Insight: Appropriate  Engagement in Group:  Engaged  Modes of Intervention:  Discussion  Additional Comments: The patient expressed that she attended music therapy.The patient also said that today was a better day for her.  Octavio Mannshigpen, Amunique Neyra Lee 07/05/2014, 8:29 PM

## 2014-07-05 NOTE — Progress Notes (Signed)
D Shanda BumpsJessica remains isolative to her room,  She is still ruminating her conflicted feelings related to her family and how ( she explains) they disapprove of her and have hurt her. She does drink plenty of Ensure and says it " is good".  She says her migraine headaches are " the same" and she discussses at length, what her alternatives are in terms of her headaches, as related the her TBI.   A SHe completed her daily assessment and on it she wrote she denied SI and she rated her depression a " 7". She shares with this nurse that she " doesn't know what to do " about the estranged feelings between she and her family and she is encourged to journal her feelings as well as journal what things in her life she does have contraol over. She is also encouraged to process ehr feelings, as they relate to her behavior as this may give her some insight into her own needs.   R Safety is in place and poc cont.

## 2014-07-05 NOTE — [facility] Group Notes (Signed)
[facility] Group Notes:  (Clinical Social Work)  07/05/2014   11:15am-12:00pm  Summary of Progress/Problems:  The main focus of today's process group was to listen to a variety of genres of music and to identify that different types of music provoke different responses.  The patient then was able to identify personally what was soothing for them, as well as energizing.  Handouts were used to record feelings evoked, as well as how patient can personally use this knowledge in sleep habits, with depression, and with other symptoms.  The patient expressed nothing during group, left on the second song rapidly with angry affect, did not return.  Type of Therapy:  Music Therapy   Participation Level:  Minimal  Participation Quality:  Resistant  Affect:  Blunted and Irritable  Cognitive:  Not Assessed  Insight:  Limited  Engagement in Therapy:  Limited  Modes of Intervention:   Activity, Exploration  Lori MantleMareida Grossman-Orr, LCSW 07/05/2014, 12:30pm

## 2014-07-06 ENCOUNTER — Encounter (HOSPITAL_COMMUNITY): Payer: Self-pay | Admitting: Psychiatry

## 2014-07-06 DIAGNOSIS — F1220 Cannabis dependence, uncomplicated: Secondary | ICD-10-CM | POA: Diagnosis present

## 2014-07-06 DIAGNOSIS — G43009 Migraine without aura, not intractable, without status migrainosus: Secondary | ICD-10-CM

## 2014-07-06 DIAGNOSIS — F332 Major depressive disorder, recurrent severe without psychotic features: Principal | ICD-10-CM

## 2014-07-06 DIAGNOSIS — G43909 Migraine, unspecified, not intractable, without status migrainosus: Secondary | ICD-10-CM | POA: Diagnosis present

## 2014-07-06 DIAGNOSIS — F603 Borderline personality disorder: Secondary | ICD-10-CM | POA: Diagnosis present

## 2014-07-06 LAB — PREGNANCY, URINE: PREG TEST UR: NEGATIVE

## 2014-07-06 MED ORDER — MEGESTROL ACETATE 400 MG/10ML PO SUSP
400.0000 mg | Freq: Every day | ORAL | Status: DC
  Administered 2014-07-06 – 2014-07-11 (×6): 400 mg via ORAL
  Filled 2014-07-06: qty 100
  Filled 2014-07-06 (×7): qty 10

## 2014-07-06 MED ORDER — QUETIAPINE FUMARATE 25 MG PO TABS
25.0000 mg | ORAL_TABLET | Freq: Every day | ORAL | Status: DC
  Administered 2014-07-06 – 2014-07-10 (×5): 25 mg via ORAL
  Filled 2014-07-06 (×2): qty 1
  Filled 2014-07-06: qty 14
  Filled 2014-07-06 (×5): qty 1

## 2014-07-06 MED ORDER — ACETAMINOPHEN 325 MG PO TABS
650.0000 mg | ORAL_TABLET | Freq: Four times a day (QID) | ORAL | Status: DC | PRN
Start: 2014-07-06 — End: 2014-07-11
  Administered 2014-07-06 – 2014-07-10 (×3): 650 mg via ORAL
  Filled 2014-07-06 (×3): qty 2

## 2014-07-06 MED ORDER — DULOXETINE HCL 30 MG PO CPEP
30.0000 mg | ORAL_CAPSULE | Freq: Every day | ORAL | Status: DC
  Administered 2014-07-06 – 2014-07-08 (×3): 30 mg via ORAL
  Filled 2014-07-06 (×5): qty 1

## 2014-07-06 MED ORDER — ONDANSETRON 4 MG PO TBDP
4.0000 mg | ORAL_TABLET | Freq: Three times a day (TID) | ORAL | Status: DC | PRN
  Administered 2014-07-08 – 2014-07-11 (×2): 4 mg via ORAL
  Filled 2014-07-06 (×4): qty 1

## 2014-07-06 MED ORDER — POTASSIUM CHLORIDE CRYS ER 10 MEQ PO TBCR
10.0000 meq | EXTENDED_RELEASE_TABLET | Freq: Two times a day (BID) | ORAL | Status: AC
Start: 2014-07-06 — End: 2014-07-07
  Administered 2014-07-06 – 2014-07-07 (×2): 10 meq via ORAL
  Filled 2014-07-06 (×2): qty 1

## 2014-07-06 MED ORDER — CLINDAMYCIN HCL 150 MG PO CAPS
150.0000 mg | ORAL_CAPSULE | Freq: Three times a day (TID) | ORAL | Status: DC
  Administered 2014-07-06 – 2014-07-11 (×14): 150 mg via ORAL
  Filled 2014-07-06 (×10): qty 1
  Filled 2014-07-06: qty 9
  Filled 2014-07-06 (×11): qty 1
  Filled 2014-07-06 (×2): qty 9

## 2014-07-06 NOTE — Progress Notes (Signed)
D: Patient isolative to room this morning.  She did attend group with no participation.  She got up during group and asked to leave stating, "I'm having a panic attack."  Patient later told this nurse that she get anxious being around others.  She presents with a sad, depressed mood.  She rates her depression as a 6; hopelessness as a 6 and anxiety as an 8.  She reports good sleep and appetite.  She denies SI/HI/AVH.   A: Continue to monitor medication management and MD orders.  Safety checks completed every 15 minutes per protocol.  Meet 1:1 with patient to discuss needs and offer encouragement. R: Patient's behavior is appropriate.
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
## 2014-07-06 NOTE — Tx Team (Signed)
  Interdisciplinary Treatment Plan Update   Date Reviewed:  07/06/2014  Time Reviewed:  8:40 AM  Progress in Treatment:   Attending groups: Yes Participating in groups: Yes Taking medication as prescribed: Yes  Tolerating medication: Yes Family/Significant other contact made: No Patient understands diagnosis: Yes AEB asking for help with depression, thoughts of suicide Discussing patient identified problems/goals with staff: Yes  See initial care plan Medical problems stabilized or resolved: Yes Denies suicidal/homicidal ideation: Yes  In tx team Patient has not harmed self or others: Yes  For review of initial/current patient goals, please see plan of care.  Estimated Length of Stay:  4-5 days  Reason for Continuation of Hospitalization: Depression Medication stabilization  Medical issues [pain, headaches]  New Problems/Goals identified:  N/A  Discharge Plan or Barriers:   return home, follow up outpt  Additional Comments:   [name] is an 29 y.o. female presenting [facility] ED after a suicide attempt. Pt stated "I was trying to kill myself". "I tried to cut myself but the knife was too dull and then I thought about walking into traffic". Pt reported that she is dealing with multiple stressors such as financial problems, family issues, arguments with her girlfriend and having migraines. Pt is endorsing multiple depressive symptoms and shared that for the past week her sleep and appetite have been poor. Pt reported that she has difficulty falling asleep and early morning awakening. Pt denies HI at this time and stated "I could never forgive myself ". "I would rather just erase myself and solve the problem right there". Pt is endorsing auditory hallucinations but stated "I try not to talk about it because people will think I am crazy". "The voices told me to cut myself and to walk into traffic; I just agreed with them". Pt did not report any alcohol use but shared that she smokes marijuana  for her migraines. Pt stated "it helps with my migraines so I smoke every now and then". "My headaches are miserable". Pt reported that [facility] and she attends counseling sessions weekly.  Celexa trial  Attendees:  Signature: Ivin BootySarama Eappen, MD 07/06/2014 8:40 AM   Signature: Richelle Itood Eila Runyan, LCSW 07/06/2014 8:40 AM  Signature: 07/06/2014 8:40 AM  Signature: Joslyn Devonaroline Beaudry, RN 07/06/2014 8:40 AM  Signature:  07/06/2014 8:40 AM  Signature:  07/06/2014 8:40 AM  Signature:   07/06/2014 8:40 AM  Signature:    Signature:    Signature:    Signature:    Signature:    Signature:      Scribe for Treatment Team:   Richelle Itood Letti Towell, LCSW  07/06/2014 8:40 AM

## 2014-07-06 NOTE — Progress Notes (Signed)
Patient ID: [name], female   DOB: October 06, 1985, 29 y.o.   MRN: [id number] Client c/o tooth pain, writer reported to [employer] [employer] Client assessed and right lower tooth was noted to be broken, and appeared abscessed. Received new orders for Clindamycin 150 mg and acetaminophen 650 mg.

## 2014-07-06 NOTE — [facility] Group Notes (Signed)
[facility]   07/06/2014 11:12 AM  Participation Quality:  Minimal  Mood/Affect:  Flat  Depression Rating:    Anxiety Rating:    Thoughts of Suicide:  No Will you contract for safety?   NA  Current AVH:  No  Plan for Discharge/Comments:  Stayed in group long enough to say someone called the police because she was trying to kill herself.  Went on to say that she is feeling like she will have a panic attack, and asked to be excused.  Transportation Means:   Supports:  Daryel GeraldNorth, Zaylah Blecha B

## 2014-07-06 NOTE — Progress Notes (Signed)
D: Pt denies SI/HI/AV. Pt is isolative in room. Pt expressed concerns over family relationship and acceptance. Patient stated that her girlfriend is supportive to her. A: Pt was offered support and encouragement.  Patient encouraged to express feelings over family. Patient encouraged to journal to offer insight. Pt was encouraged to attend groups. Q 15 minute checks were done for safety.  R:Pt attended night group and interacted well with peers and staff. Pt has expressed that she had headache but otherwise no complaints noted. Pt receptive to treatment and safety maintained on unit.

## 2014-07-06 NOTE — Progress Notes (Signed)
[facility] Group Notes:  (Nursing/MHT/Case Management/Adjunct)  Date:  07/06/2014  Time:  8:52 PM  Type of Therapy:  Psychoeducational Skills  Participation Level:  Minimal  Participation Quality:  Attentive  Affect:  Blunted  Cognitive:  Lacking  Insight:  Improving  Engagement in Group:  Limited  Modes of Intervention:  Education  Summary of Progress/Problems: Patient expressed in group that she attended one group today, but she felt that it was ineffective. She also indicated that she had a better day since felt happier today. The patient was unable to state what her goal is going to be tomorrow.   Hazle CocaGOODMAN, Nandan Willems S 07/06/2014, 8:52 PM

## 2014-07-06 NOTE — Progress Notes (Signed)
Patient ID: [name], female   DOB: 12/18/1985, 29 y.o.   MRN: [id number] D: Client is pleasant, interacts appropriately with staff and peers. Smiles occasionally, but also exhibits some s/s of anxiety when discussing procedures, i.e. Labs, EKG. Client also asks questions and say "did I ask you that already" as if to have periods of memory lapse or uncertainty. A: Writer introduced self to client, educated client about EKG, reviewed medications, administered as ordered Staff will monitor q2115min for safety. R: Client is safe on the unit, attended group.

## 2014-07-06 NOTE — Plan of Care (Signed)
Problem: Ineffective individual coping Goal: LTG: Patient will report a decrease in negative feelings Outcome: Progressing Patient stated that she feels better than she did yesterday.  Goal: STG: Pt will be able to identify effective and ineffective STG: Pt will be able to identify effective and ineffective coping patterns  Outcome: Progressing Patient stated that she could be more verbal with expressing her feelings and journaling.

## 2014-07-07 LAB — BASIC METABOLIC PANEL
ANION GAP: 9 (ref 5–15)
BUN: 16 mg/dL (ref 6–23)
CO2: 25 mmol/L (ref 19–32)
Calcium: 9.6 mg/dL (ref 8.4–10.5)
Chloride: 104 mmol/L (ref 96–112)
Creatinine, Ser: 0.61 mg/dL (ref 0.50–1.10)
GFR calc Af Amer: >90 mL/min (ref >90)
GFR calc non Af Amer: >90 mL/min (ref >90)
Glucose, Bld: 90 mg/dL (ref 70–99)
POTASSIUM: 3.8 mmol/L (ref 3.5–5.1)
Sodium: 138 mmol/L (ref 135–145)

## 2014-07-07 LAB — MAGNESIUM: MAGNESIUM: 2 mg/dL (ref 1.5–2.5)
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
## 2014-07-07 NOTE — [facility] Group Notes (Signed)
[facility] LCSW Group Therapy  07/07/2014 , 1:44 PM   Type of Therapy:  Group Therapy  Participation Level:  Active  Participation Quality:  Attentive  Affect:  Appropriate  Cognitive:  Alert  Insight:  Improving  Engagement in Therapy:  Engaged  Modes of Intervention:  Discussion, Exploration and Socialization  Summary of Progress/Problems: Today's group focused on the term Diagnosis.  Participants were asked to define the term, and then pronounce whether it is a negative, positive or neutral term.  Engaged throughout.  Better mood today, even cracked a smile a couple of times.  Was willing to contribute when questioned directly.  Talked about her struggles with chronic pain and how she needs to isolate herself from others when she is experiencing it "or else I go ballistic even though it is not their fault."  However went on to say that many others are "not supportive through their ignorance," and she has no time for them.  Unfortunately, that leaves less than a handful of people who she feels are supportive, and she does not anticipate that expanding.  Daryel Geraldorth, Mychael Soots B 07/07/2014 , 1:44 PM

## 2014-07-07 NOTE — [facility] Suicide Risk Assessment (Signed)
[facility] INPATIENT:  Family/Significant Other Suicide Prevention Education  Suicide Prevention Education:  Education Completed; Luberta RobertsonJa-Naye Drye, SO, [id number]  has been identified by the patient as the family member/significant other with whom the patient will be residing, and identified as the person(s) who will aid the patient in the event of a mental health crisis (suicidal ideations/suicide attempt).  With written consent from the patient, the family member/significant other has been provided the following suicide prevention education, prior to the and/or following the discharge of the patient.  The suicide prevention education provided includes the following:  Suicide risk factors  Suicide prevention and interventions  National Suicide Hotline telephone number  [facility] assessment telephone number  [facility] Emergency Assistance 911  [facility] and/or Residential Mobile Crisis Unit telephone number  Request made of family/significant other to:  Remove weapons (e.g., guns, rifles, knives), all items previously/currently identified as safety concern.    Remove drugs/medications (over-the-counter, prescriptions, illicit drugs), all items previously/currently identified as a safety concern.  The family member/significant other verbalizes understanding of the suicide prevention education information provided.  The family member/significant other agrees to remove the items of safety concern listed above.  Daryel Geraldorth, Lajuan Kovaleski B 07/07/2014, 4:36 PM

## 2014-07-07 NOTE — Progress Notes (Signed)
Patient ID: Lori MasterJessica Rowe, female   DOB: 1985/09/22, 29 y.o.   MRN: [id number] D: Client is visible on the unit, sitting at the end of the hall doing crosswords. Client reports "bad day a little bit" "I went to the lab and the lady was making fun of me, telling me to hold still, I'm a big girl" "I'm nervous, I can't help it I'm nervous of needles" "the man in the cafeteria was making fun of my eating a small amount, but I can't eat a lot" "I don't like being mocked, I don't bother anybody, I stay to myself and I don't want nobody mocking me, but the nurse was nice she spoke to him about it" Client reports anxiety at "6" of 10, "it's better with the medicine" A: Writer provided emotional support, encouraged client to let others know when they make her feel uncomfortable as they may not be aware that what they are saying is offensive. Staff will monitor q7515min for safety. R: client is safe on the unit, attended group.

## 2014-07-07 NOTE — Progress Notes (Signed)
D.Patient has spent most of day in the hall writing and drawing. Affect flat, depressed and sad.Denies SI, HI, AVH at this time.  A: Patient given emotional support from RN. Patient given medications per MD orders. Patient encouraged to attend groups and unit activities. Patient encouraged to come to staff with any questions or concerns.  R: Patient remains cooperative and appropriate. Will continue to monitor patient for safety.

## 2014-07-08 MED ORDER — DULOXETINE HCL 30 MG PO CPEP
30.0000 mg | ORAL_CAPSULE | Freq: Two times a day (BID) | ORAL | Status: DC
  Administered 2014-07-08 – 2014-07-11 (×6): 30 mg via ORAL
  Filled 2014-07-08 (×2): qty 1
  Filled 2014-07-08: qty 14
  Filled 2014-07-08 (×6): qty 1
  Filled 2014-07-08: qty 14
  Filled 2014-07-08 (×2): qty 1
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
## 2014-07-08 NOTE — Progress Notes (Signed)
Adult Psychoeducational Group Note  Date:  07/08/2014 Time: 10:00am  Group Topic/Focus:  Healthy Communication:   The focus of this group is to discuss communication, barriers to communication, as well as healthy ways to communicate with others. Orientation:   The focus of this group is to educate the patient on the purpose and policies of crisis stabilization and provide a format to answer questions about their admission.  The group details unit policies and expectations of patients while admitted.  Participation Level:  Did Not Attend  Participation Quality: n/a  Affect: n/a  Cognitive: n/a  Insight: n/a  Engagement in Group: n/a  Modes of Intervention:  Activity, Discussion, Education and Orientation  Additional Comments:  Pt did not attend group. Pt in bed asleep.   Aurora Maskwyman, Teejay Meader E 07/08/2014, 12:36 PM
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
## 2014-07-08 NOTE — Progress Notes (Signed)
Patient ID: [name], female   DOB: 03-31-1985, 29 y.o.   MRN: [id number] D. Patient spends time out of her room either in the dayroom or at the end of the hall doing puzzles.  Attended group but left in the middle and stood in the hall outside the door listening. States she is anxious. Has minimal appetite but states "I keep trying". Denies HI, SI, AVH. A. Reassurance provided. Medications given per MD orders. R. Patient cooperative and engaged in treatment. Will continue to monitor for safety.

## 2014-07-09 NOTE — Progress Notes (Signed)
Recreation Therapy Notes  Animal-Assisted Activity (AAA) Program Checklist/Progress Notes Patient Eligibility Criteria Checklist & Daily Group note for Rec Tx Intervention  Date: 04.14.2016 Time: 2:45pm Location: [address] Dayroom   AAA/T Program Assumption of Risk Form signed by Patient/ or Parent Legal Guardian yes  Patient is free of allergies or sever asthma yes  Patient reports no fear of animals yes  Patient reports no history of cruelty to animals yes  Patient understands his/her participation is voluntary yes  Behavioral Response: Did not attend.   Conroy Goracke L Trayvond Viets, LRT/CTRS  Rakeya Glab L 07/09/2014 4:49 PM 

## 2014-07-09 NOTE — Progress Notes (Signed)
D: Patient's mood has improved.  She states she is sleeping and eating fair.  She denies any SI/HI/AVH.  Patient appears less anxious and depressed.  She is attending more groups and interacting well with others.  She rates her depression as a 6; hopelessness as a 5; anxiety as a 7.  Patient is pleasant and cooperative. A: Continue to monitor medication management and MD orders.  Meet 1:1 with patient to discuss concerns and offer encouragement.  Safety checks completed every 15 minutes per protocol. R: Patient's behavior is appropriate.
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
## 2014-07-09 NOTE — [facility] Group Notes (Signed)
[facility] LCSW Group Therapy 07/09/2014 1:15 PM Type of Therapy: Group Therapy Participation Level: Minimal  Participation Quality: Minimal  Affect: Depressed and flat, anxious  Cognitive: Alert and Oriented  Insight: Developing/Improving and Engaged  Engagement in Therapy: Developing/Improving and Engaged  Modes of Intervention: Activity, Clarification, Confrontation, Discussion, Education, Exploration, Limit-setting, Orientation, Problem-solving, Rapport Building, Dance movement psychotherapisteality Testing, Socialization and Support  Summary of Progress/Problems: Patient was attentive and engaged with speaker from [employer]. Patient was attentive to speaker while they shared their story of dealing with mental health and overcoming it. Patient expressed interest in their programs and services and received information on their agency. Patient processed ways they can relate to the speaker. Patient left during part of speaker's presentation and returned at the end of group.   Samuella BruinKristin Evren Shankland, MSW, [employer]CSWA Clinical Social Worker [facility] [phone]

## 2014-07-09 NOTE — Progress Notes (Signed)
Patient ID: [name], female   DOB: [birth year], 29 y.o.   MRN: [id number] D: Client sits outside of the door of her room and is seen on the phone several times, no interaction noted with peers. Client anxious and perceives things as a threat "they mocking me, I don't bother nobody" Client reports anxiety as "5" of 10. "I'm ready to go home" client feels she has benefited from the medications. "I've met a few people that said really nice things to me, made me feel good about myself" A: Writer provided emotional support encouraged client to attend group and verbalize any concerns. Staff will monitor q52mn for safety. R: Client is safe on the unit, attended karaoke.

## 2014-07-09 NOTE — Progress Notes (Signed)
[facility] Group Notes:  (Nursing/MHT/Case Management/Adjunct)  Date:  07/09/2014  Time:  12:49 AM  Type of Therapy:  Group Therapy  Participation Level:  Minimal  Participation Quality:  Appropriate  Affect:  Depressed and Flat  Cognitive:  Alert, Appropriate and Oriented  Insight:  Appropriate  Engagement in Group:  Resistant  Modes of Intervention:  Discussion  Summary of Progress/Problems:  "I'm not sure of my goals". However, several minutes later pt did speak up during dialogue in reference to the mht's on the hall.  Fransico MichaelBrooks, [place] Hildebrant [facility] 07/09/2014, 12:49 AM

## 2014-07-09 NOTE — [facility] Group Notes (Signed)
[facility] Group Notes:  (Nursing/MHT/Case Management/Adjunct)  Date:  07/09/2014  Time: 0900 am  Type of Therapy:  Psychoeducational Skills  Participation Level:  Minimal  Participation Quality:  Appropriate and Attentive  Affect:  Appropriate  Cognitive:  Alert  Insight:  Appropriate  Engagement in Group:  Limited  Modes of Intervention:  Support  Summary of Progress/Problems:  [name], [name] 07/09/2014, 2:08 PM

## 2014-07-10 NOTE — [facility] Group Notes (Signed)
[facility] LCSW Group Therapy  Feelings Around Relapse 1:15 -2:30        07/10/2014 4:05 PM  Type of Therapy:  Group Therapy  Participation Level:  Appropriate  Participation Quality:  Appropriate  Affect:  Appropriate  Cognitive:  Attentive Appropriate  Insight:  Developing/Improving  Engagement in Therapy: Developing/Improving  Modes of Intervention:  Discussion Exploration Problem-Solving Supportive  Summary of Progress/Problems:  The topic for today was feelings around relapse.  Patient processed feelings toward relapse and was able to relate to peers. She advised relapsing for her would be attempting suicide.  She advised of stressors being family bullying and making fun of her.  She shared she is fortunate to have girlfriend and her family who took time to look for her, find her and got her to the hospital.  Patient identified coping skills that can be used to prevent a relapse as keeping herself around positive people.   Wynn BankerHodnett, Pama Roskos Hairston 07/10/2014  4:05 PM
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
## 2014-07-10 NOTE — [facility] Group Notes (Signed)
[facility]   07/10/2014 1:17 PM    Participation Quality:  Appropraite  Mood/Affect:  Appropriate  Depression Rating:  6  Anxiety Rating:  7  Thoughts of Suicide:  No  Will you contract for safety?   NA  Current AVH:  No  Plan for Discharge/Comments:  Patient attended discharge planning group and actively participated in group. Patient advised of plans to discharge on Saturday. She advised her CS[facility]  Suicide prevention education reviewed and SPE document provided.   Transportation Means: Patient has transportation.   Supports:  Patient has a support system.   [name], [name]

## 2014-07-10 NOTE — Progress Notes (Signed)
D:  Per pt self inventory pt reports sleeping fair, appetite fair, energy level normal, ability to pay attention good, rates depression at a 6 out of 10, hopelessness at a 6 out of 10, anxiety at a 7 out of 10, denies SI/HI/AVH, has been more visible on the unit and socializing with other patients, going to groups, flat affect but brightens during interaction.      A:  Emotional support provided, Encouraged pt to continue with treatment plan and attend all group activities, q15 min checks maintained for safety.  R:  Pt is receptive, cooperative with staff and other patients, pt's goal for today is to "try and stay in group.", pt is inquiring about her discharge plan and feels as though she is improving and will soon be ready for discharge.

## 2014-07-10 NOTE — Tx Team (Signed)
Interdisciplinary Treatment Plan Update (Adult)  Date: 07/10/2014 Time Reviewed: 12:37 PM  Progress in Treatment:  Attending groups: Yes Participating in groups:  Yes Taking medication as prescribed: Yes  Tolerating medication: Yes  Family/Significant othe contact made: Yes Patient understands diagnosis: Limited insight  Discussing patient identified problems/goals with staff: Yes  Medical problems stabilized or resolved: Yes  Denies suicidal/homicidal ideation:Yes  Patient has not harmed self or Others: Yes  New problem(s) identified: NA Discharge Plan or Barriers: Pt will return home and follow up with outpatient services.   Estimated length of stay: Pt will likely discharge tomorrow.    Attendees:  Patient:  07/10/2014 12:37 PM   Family:  4/15/201612:37 PM   Physician: Dr. Elna BreslowEappen MD  4/15/201612:37 PM  Nursing: Harrel CarinaAngela Thorn, RN  07/10/2014 12:37 PM  Clinical Social Worker: Daryel Geraldodney Demarkis [place], [place]  4/15/201612:37 PM  Clinical Social Worker: Charleston Ropesandace Hyatt, CSW Intern 4/15/201612:37 PM  Other: Seabron Spatesoneica Byrd, RN  4/15/201612:37 PM  Other:  4/15/201612:37 PM  Other:  4/15/201612:37 PM  Scribe for Treatment Team:  Charleston Ropesandace Hyatt, CSW Intern 07/10/2014 12:37 PM

## 2014-07-11 ENCOUNTER — Encounter (HOSPITAL_COMMUNITY): Payer: Self-pay | Admitting: Registered Nurse

## 2014-07-11 MED ORDER — CLINDAMYCIN HCL 150 MG PO CAPS: 150.0000 mg | ORAL_CAPSULE | Freq: Three times a day (TID) | ORAL | Status: DC

## 2014-07-11 MED ORDER — MEGESTROL ACETATE 400 MG/10ML PO SUSP: 400.0000 mg | Freq: Every day | ORAL | Status: DC

## 2014-07-11 MED ORDER — DULOXETINE HCL 30 MG PO CPEP: 30.0000 mg | ORAL_CAPSULE | Freq: Two times a day (BID) | ORAL | Status: DC

## 2014-07-11 MED ORDER — HYDROXYZINE HCL 25 MG PO TABS: 25.0000 mg | ORAL_TABLET | Freq: Three times a day (TID) | ORAL | Status: DC | PRN

## 2014-07-11 MED ORDER — QUETIAPINE FUMARATE 25 MG PO TABS: 25.0000 mg | ORAL_TABLET | Freq: Every day | ORAL | Status: DC

## 2014-07-11 NOTE — B[facility] Group Notes (Signed)
The focus of this group is to educate the patient on the purpose and policies of crisis stabilization and provide a format to answer questions about their admission.  The group details unit policies and expectations of patients while admitted.  Pt did not attend 0900 nursing group this morning.  
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
## 2014-07-11 NOTE — [facility] Suicide Risk Assessment (Signed)
[facility]   Demographic Factors:  Low socioeconomic status  Total Time spent with patient: 45 minutes  Musculoskeletal: Strength & Muscle Tone: within normal limits Gait & Station: normal Patient leans: N/A  Psychiatric Specialty Exam: Physical Exam  ROS  Blood pressure 127/81, pulse 108, temperature 98.4 F (36.9 C), temperature source Oral, resp. rate 18, height 5' 5.5" (1.664 m), weight 64.864 kg (143 lb), last menstrual period 07/02/2014.Body mass index is 23.43 kg/(m^2).  General Appearance: Casual  Eye Contact::  Fair  Speech:  Normal Rate409  Volume:  Decreased  Mood:  Anxious  Affect:  Appropriate  Thought Process:  Coherent  Orientation:  Full (Time, Place, and Person)  Thought Content:  Rumination  Suicidal Thoughts:  No  Homicidal Thoughts:  No  Memory:  Immediate;   Good Recent;   Good Remote;   Good  Judgement:  Good  Insight:  Good  Psychomotor Activity:  Normal  Concentration:  Good  Recall:  Fair  Fund of Knowledge:Good  Language: Good  Akathisia:  No  Handed:  Right  AIMS (if indicated):     Assets:  Communication Skills Desire for Improvement Housing Physical Health Resilience Social Support Transportation Vocational/Educational  Sleep:  Number of Hours: 4.75  Cognition: WNL  ADL's:  Intact   Have you used any form of tobacco in the last 30 days? (Cigarettes, Smokeless Tobacco, Cigars, and/or Pipes): Yes  Has this patient used any form of tobacco in the last 30 days? (Cigarettes, Smokeless Tobacco, Cigars, and/or Pipes) Yes, Prescription not provided because: Patient declined  Mental Status Per Nursing Assessment::   On Admission:     Current Mental Status by Physician: See above.  Patient does not have any suicidal thoughts or hallucination.  She is feeling improvement with the medication and hospitalization stay.  Loss Factors: Loss of significant relationship and Financial problems/change in socioeconomic  status  Historical Factors: Prior suicide attempts, Family history of mental illness or substance abuse and Impulsivity  Risk Reduction Factors:   Sense of responsibility to family, Religious beliefs about death, Living with another person, especially a relative, Positive social support, Positive therapeutic relationship and Positive coping skills or problem solving skills  Continued Clinical Symptoms:  Depression:   Impulsivity Chronic Pain Unstable or Poor Therapeutic Relationship Previous Psychiatric Diagnoses and Treatments Medical Diagnoses and Treatments/Surgeries  Cognitive Features That Contribute To Risk:  None    Suicide Risk:  Minimal: No identifiable suicidal ideation.  Patients presenting with no risk factors but with morbid ruminations; may be classified as minimal risk based on the severity of the depressive symptoms  Principal Problem: MDD (major depressive disorder), recurrent severe, without psychosis Discharge Diagnoses:  Patient Active Problem List   Diagnosis Date Noted  . Borderline personality disorder [F60.3] 07/06/2014  . MDD (major depressive disorder), recurrent severe, without psychosis [F33.2] 07/06/2014  . Cannabis use disorder, severe, dependence [F12.20] 07/06/2014  . Migraine headache [G43.909] 07/06/2014  . Generalized anxiety disorder [F41.1] 07/04/2014  . PTSD (post-traumatic stress disorder) [F43.10]   . Syncope [R55] 04/03/2011  . EAVWUJWJ(191.4Headache(784.0) [R51] 04/03/2011    Follow-up Information    [facility].   Specialty:  Professional Counselor   Why:  Therapy appointment on Tuesday April 19th at 11 am with Lawanna KobusAlyssa Branch who can make assist you in getting set up with medication management services as well. Please call office you need to reschedule.   Contact information:  [facility] [place] [address] [place] [address] [phone]       [facility].   Why:  Primary care appointment on Thursday May 5th at 10 am. Please call office if you need to reschedule.   Contact information:   [address]  [place], [place] [address]  [phone]  fax [phone] (ATTNDellia Cloud)      Plan Of Care/Follow-up recommendations:  Activity:  As tolerated Diet:  Unchanged from the past Other:  Patient will follow-up at [facility] [place] [facility] for her medication management.  Is patient on multiple antipsychotic therapies at discharge:  No   Has Patient had three or more failed trials of antipsychotic monotherapy by history:  No  Recommended Plan for Multiple Antipsychotic Therapies: NA    Travaris Kosh T. 07/11/2014, 9:06 AM

## 2014-07-11 NOTE — Progress Notes (Signed)
Pt is D/C home. Pt denies SI/HI/AV. Pt follow-up and medications were reviewed and pt verbalized understanding. Pt pleasant and cooperative. Pt belongings were returned.   

## 2014-07-11 NOTE — Progress Notes (Signed)
Psychoeducational Group Note  Date:  07/11/2014 Time:  0027  Group Topic/Focus:  Wrap-Up Group:   The focus of this group is to help patients review their daily goal of treatment and discuss progress on daily workbooks.  Participation Level: Did Not Attend  Participation Quality:  Not Applicable  Affect:  Not Applicable  Cognitive:  Not Applicable  Insight:  Not Applicable  Engagement in Group: Not Applicable  Additional Comments:  The patient did not attend group this evening.   Hosteen Kienast S 07/11/2014, 12:26 AM
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
## 2014-07-13 NOTE — Progress Notes (Signed)
  [facility] :  Will you be returning to the same living situation after discharge:  Yes,  home At discharge, do you have transportation home?: Yes,  family Do you have the ability to pay for your medications: Yes,  mental health  Release of information consent forms completed and in the chart;  Patient's signature needed at discharge.  Patient to Follow up at: Follow-up Information    [facility] 07[facility]14/2014.   Specialty:  Professional Counselor   Why:  Therapy appointment on Tuesday April 19th at 11 am with Lawanna KobusAlyssa Branch who can make assist you in getting set up with medication management services as well. Please call office you need to reschedule.   Contact information:   [facility] [address] [place] [address] [place] [address] [phone]       Follow up with [facility].   Why:  Primary care appointment on Thursday May 5th at 10 am. Please call office if you need to reschedule.   Contact information:   [address]  [place], [place] [address]  [phone]  fax [phone] (ATTNDellia Cloud: Kesha)      Patient denies SI/HI: Yes,  yes    Safety Planning and Suicide Prevention discussed: Yes,  yes  Have you used any form of tobacco in the last 30 days? (Cigarettes, Smokeless Tobacco, Cigars, and/or Pipes): Yes  Has patient been referred to the Quitline?: Patient refused referral  Ida Rogueorth, Alton Bouknight B 07/13/2014, 11:34 AM

## 2014-07-15 NOTE — Progress Notes (Signed)
Patient Discharge Instructions:  After Visit Summary (AVS):   Faxed to:  07/15/14 Discharge Summary Note:   Faxed to:  07/15/14 Psychiatric Admission Assessment Note:   Faxed to:  07/15/14 Suicide Risk Assessment - Discharge Assessment:   Faxed to:  07/15/14 Faxed/Sent to the Next Level Care provider:  07/15/14 Faxed to [facility] - [phone] Faxed to [facility] @ [phone]  Jerelene ReddenSheena E Eland, 07/15/2014, 4:02 PM

## 2015-03-12 ENCOUNTER — Emergency Department (HOSPITAL_COMMUNITY)
Admission: EM | Admit: 2015-03-12 | Discharge: 2015-03-12 | Disposition: A | Discharge status: Home / Self Care | Payer: Self-pay | Attending: Emergency Medicine | Admitting: Emergency Medicine

## 2015-03-12 ENCOUNTER — Encounter (HOSPITAL_COMMUNITY): Payer: Self-pay | Admitting: Emergency Medicine

## 2015-03-12 DIAGNOSIS — F17210 Nicotine dependence, cigarettes, uncomplicated: Secondary | ICD-10-CM | POA: Insufficient documentation

## 2015-03-12 DIAGNOSIS — Z880 Allergy status to penicillin: Secondary | ICD-10-CM | POA: Insufficient documentation

## 2015-03-12 DIAGNOSIS — F4310 Post-traumatic stress disorder, unspecified: Secondary | ICD-10-CM | POA: Insufficient documentation

## 2015-03-12 DIAGNOSIS — Z8719 Personal history of other diseases of the digestive system: Secondary | ICD-10-CM | POA: Insufficient documentation

## 2015-03-12 DIAGNOSIS — R110 Nausea: Secondary | ICD-10-CM | POA: Insufficient documentation

## 2015-03-12 DIAGNOSIS — J45909 Unspecified asthma, uncomplicated: Secondary | ICD-10-CM | POA: Insufficient documentation

## 2015-03-12 DIAGNOSIS — F410 Panic disorder [episodic paroxysmal anxiety] without agoraphobia: Secondary | ICD-10-CM

## 2015-03-12 DIAGNOSIS — Z8711 Personal history of peptic ulcer disease: Secondary | ICD-10-CM | POA: Insufficient documentation

## 2015-03-12 DIAGNOSIS — R519 Headache, unspecified: Secondary | ICD-10-CM

## 2015-03-12 DIAGNOSIS — R51 Headache: Secondary | ICD-10-CM | POA: Insufficient documentation

## 2015-03-12 MED ORDER — LORAZEPAM 1 MG PO TABS
1.0000 mg | ORAL_TABLET | Freq: Once | ORAL | Status: AC
  Administered 2015-03-12: 1 mg via ORAL
  Filled 2015-03-12: qty 1
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
## 2015-03-12 NOTE — ED Notes (Signed)
PA at bedside.

## 2015-03-12 NOTE — Discharge Instructions (Signed)
It was my pleasure taking care of you today!  Have someone stay with you tonight to ensure no new or worsening symptoms. Follow up with your physician in the next 3-5 days for discussion of today's events, further management of your condition.
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
## 2015-05-02 ENCOUNTER — Emergency Department (HOSPITAL_COMMUNITY): Payer: Self-pay | Admitting: Anesthesiology

## 2015-05-02 ENCOUNTER — Emergency Department (HOSPITAL_COMMUNITY): Payer: MEDICAID | Admitting: Anesthesiology

## 2015-05-02 ENCOUNTER — Encounter (HOSPITAL_COMMUNITY)
Admission: EM | Disposition: A | Discharge status: Home / Self Care | Payer: Self-pay | Source: Home / Self Care | Attending: Orthopedic Surgery

## 2015-05-02 ENCOUNTER — Inpatient Hospital Stay (HOSPITAL_COMMUNITY)
Admission: EM | Admit: 2015-05-02 | Discharge: 2015-05-06 | DRG: 501 | Disposition: A | Discharge status: Home / Self Care | Payer: Self-pay | Attending: Orthopedic Surgery | Admitting: Orthopedic Surgery

## 2015-05-02 ENCOUNTER — Encounter (HOSPITAL_COMMUNITY): Payer: Self-pay | Admitting: Emergency Medicine

## 2015-05-02 DIAGNOSIS — F845 Asperger's syndrome: Secondary | ICD-10-CM | POA: Diagnosis present

## 2015-05-02 DIAGNOSIS — S61552A Open bite of left wrist, initial encounter: Secondary | ICD-10-CM | POA: Diagnosis present

## 2015-05-02 DIAGNOSIS — L089 Local infection of the skin and subcutaneous tissue, unspecified: Secondary | ICD-10-CM

## 2015-05-02 DIAGNOSIS — Z23 Encounter for immunization: Secondary | ICD-10-CM

## 2015-05-02 DIAGNOSIS — F419 Anxiety disorder, unspecified: Secondary | ICD-10-CM | POA: Diagnosis present

## 2015-05-02 DIAGNOSIS — F4310 Post-traumatic stress disorder, unspecified: Secondary | ICD-10-CM | POA: Diagnosis present

## 2015-05-02 DIAGNOSIS — D649 Anemia, unspecified: Secondary | ICD-10-CM | POA: Diagnosis present

## 2015-05-02 DIAGNOSIS — M65132 Other infective (teno)synovitis, left wrist: Principal | ICD-10-CM | POA: Diagnosis present

## 2015-05-02 DIAGNOSIS — F1290 Cannabis use, unspecified, uncomplicated: Secondary | ICD-10-CM | POA: Diagnosis present

## 2015-05-02 DIAGNOSIS — G5602 Carpal tunnel syndrome, left upper limb: Secondary | ICD-10-CM | POA: Diagnosis present

## 2015-05-02 DIAGNOSIS — J45909 Unspecified asthma, uncomplicated: Secondary | ICD-10-CM | POA: Diagnosis present

## 2015-05-02 DIAGNOSIS — T148XXA Other injury of unspecified body region, initial encounter: Secondary | ICD-10-CM

## 2015-05-02 DIAGNOSIS — F17210 Nicotine dependence, cigarettes, uncomplicated: Secondary | ICD-10-CM | POA: Diagnosis present

## 2015-05-02 DIAGNOSIS — W540XXA Bitten by dog, initial encounter: Secondary | ICD-10-CM

## 2015-05-02 HISTORY — DX: Autistic disorder: F84.0

## 2015-05-02 HISTORY — DX: Asperger's syndrome: F84.5

## 2015-05-02 HISTORY — PX: I & D EXTREMITY: SHX5045

## 2015-05-02 LAB — BASIC METABOLIC PANEL
Anion gap: 8 (ref 5–15)
BUN: 7 mg/dL (ref 6–20)
CALCIUM: 9.1 mg/dL (ref 8.9–10.3)
CO2: 27 mmol/L (ref 22–32)
Chloride: 107 mmol/L (ref 101–111)
Creatinine, Ser: 0.6 mg/dL (ref 0.44–1.00)
GFR calc Af Amer: >60 mL/min (ref >60)
GLUCOSE: 82 mg/dL (ref 65–99)
Potassium: 3.7 mmol/L (ref 3.5–5.1)
Sodium: 142 mmol/L (ref 135–145)

## 2015-05-02 LAB — CBC WITH DIFFERENTIAL/PLATELET
BASOS ABS: 0 10*3/uL (ref 0.0–0.1)
BASOS PCT: 0 %
EOS PCT: 4 %
Eosinophils Absolute: 0.3 10*3/uL (ref 0.0–0.7)
HCT: 33.6 % — ABNORMAL LOW (ref 36.0–46.0)
Hemoglobin: 11.2 g/dL — ABNORMAL LOW (ref 12.0–15.0)
Lymphocytes Relative: 29 %
Lymphs Abs: 2.1 10*3/uL (ref 0.7–4.0)
MCH: 29.2 pg (ref 26.0–34.0)
MCHC: 33.3 g/dL (ref 30.0–36.0)
MCV: 87.5 fL (ref 78.0–100.0)
MONO ABS: 0.6 10*3/uL (ref 0.1–1.0)
Monocytes Relative: 8 %
Neutro Abs: 4.3 10*3/uL (ref 1.7–7.7)
Neutrophils Relative %: 59 %
PLATELETS: 237 10*3/uL (ref 150–400)
RBC: 3.84 MIL/uL — ABNORMAL LOW (ref 3.87–5.11)
RDW: 13.2 % (ref 11.5–15.5)
WBC: 7.3 10*3/uL (ref 4.0–10.5)

## 2015-05-02 LAB — I-STAT BETA HCG BLOOD, ED (MC, WL, AP ONLY): I-stat hCG, quantitative: <5 m[IU]/mL (ref <5)

## 2015-05-02 LAB — SEDIMENTATION RATE: SED RATE: 10 mm/h (ref 0–22)

## 2015-05-02 SURGERY — IRRIGATION AND DEBRIDEMENT EXTREMITY
Anesthesia: General | Site: Arm Lower | Laterality: Left

## 2015-05-02 MED ORDER — PROPOFOL 10 MG/ML IV BOLUS
INTRAVENOUS | Status: AC
  Filled 2015-05-02: qty 20

## 2015-05-02 MED ORDER — CLINDAMYCIN PHOSPHATE 600 MG/50ML IV SOLN
600.0000 mg | Freq: Three times a day (TID) | INTRAVENOUS | Status: DC
  Administered 2015-05-02 – 2015-05-06 (×12): 600 mg via INTRAVENOUS
  Filled 2015-05-02 (×16): qty 50

## 2015-05-02 MED ORDER — ONDANSETRON HCL 4 MG/2ML IJ SOLN
4.0000 mg | Freq: Four times a day (QID) | INTRAMUSCULAR | Status: DC | PRN
  Administered 2015-05-03 – 2015-05-06 (×8): 4 mg via INTRAVENOUS
  Filled 2015-05-02 (×8): qty 2

## 2015-05-02 MED ORDER — MIDAZOLAM HCL 2 MG/2ML IJ SOLN
INTRAMUSCULAR | Status: AC
  Filled 2015-05-02: qty 2

## 2015-05-02 MED ORDER — HYDROXYZINE HCL 25 MG PO TABS
25.0000 mg | ORAL_TABLET | Freq: Three times a day (TID) | ORAL | Status: DC | PRN
  Administered 2015-05-06: 25 mg via ORAL
  Filled 2015-05-02: qty 1

## 2015-05-02 MED ORDER — ALPRAZOLAM 0.5 MG PO TABS
0.5000 mg | ORAL_TABLET | Freq: Four times a day (QID) | ORAL | Status: DC | PRN
  Administered 2015-05-03 – 2015-05-04 (×5): 0.5 mg via ORAL
  Filled 2015-05-02 (×5): qty 1

## 2015-05-02 MED ORDER — POLYETHYLENE GLYCOL 3350 17 G PO PACK
17.0000 g | PACK | Freq: Every day | ORAL | Status: DC | PRN
  Administered 2015-05-06: 17 g via ORAL
  Filled 2015-05-02: qty 1

## 2015-05-02 MED ORDER — SODIUM CHLORIDE 0.9 % IV BOLUS (SEPSIS)
1000.0000 mL | Freq: Once | INTRAVENOUS | Status: AC
  Administered 2015-05-02: 1000 mL via INTRAVENOUS

## 2015-05-02 MED ORDER — MIDAZOLAM HCL 2 MG/2ML IJ SOLN
INTRAMUSCULAR | Status: DC | PRN
  Administered 2015-05-02: 2 mg via INTRAVENOUS

## 2015-05-02 MED ORDER — SODIUM CHLORIDE 0.9 % IV SOLN
INTRAVENOUS | Status: DC | PRN
  Administered 2015-05-02: 21:00:00 via INTRAVENOUS

## 2015-05-02 MED ORDER — MIDAZOLAM HCL 2 MG/2ML IJ SOLN
2.0000 mg | Freq: Once | INTRAMUSCULAR | Status: AC
  Administered 2015-05-02: 2 mg via INTRAVENOUS

## 2015-05-02 MED ORDER — HYDROMORPHONE HCL 1 MG/ML IJ SOLN
0.2500 mg | INTRAMUSCULAR | Status: DC | PRN
  Administered 2015-05-02 (×2): 0.25 mg via INTRAVENOUS

## 2015-05-02 MED ORDER — MORPHINE SULFATE (PF) 2 MG/ML IV SOLN
2.0000 mg | INTRAVENOUS | Status: DC | PRN
  Administered 2015-05-03: 4 mg via INTRAVENOUS
  Administered 2015-05-03 – 2015-05-04 (×4): 2 mg via INTRAVENOUS
  Administered 2015-05-05 (×2): 4 mg via INTRAVENOUS
  Administered 2015-05-06: 2 mg via INTRAVENOUS
  Filled 2015-05-02: qty 1
  Filled 2015-05-02: qty 2
  Filled 2015-05-02: qty 1
  Filled 2015-05-02: qty 2
  Filled 2015-05-02 (×3): qty 1
  Filled 2015-05-02: qty 2

## 2015-05-02 MED ORDER — SUCCINYLCHOLINE CHLORIDE 20 MG/ML IJ SOLN
INTRAMUSCULAR | Status: AC
  Filled 2015-05-02: qty 1

## 2015-05-02 MED ORDER — ONDANSETRON HCL 4 MG/2ML IJ SOLN
INTRAMUSCULAR | Status: AC
  Filled 2015-05-02: qty 2

## 2015-05-02 MED ORDER — PROPOFOL 10 MG/ML IV BOLUS
INTRAVENOUS | Status: DC | PRN
  Administered 2015-05-02: 130 mg via INTRAVENOUS

## 2015-05-02 MED ORDER — LIDOCAINE HCL (CARDIAC) 20 MG/ML IV SOLN
INTRAVENOUS | Status: DC | PRN
  Administered 2015-05-02: 60 mg via INTRATRACHEAL

## 2015-05-02 MED ORDER — ONDANSETRON HCL 4 MG/2ML IJ SOLN
INTRAMUSCULAR | Status: DC | PRN
  Administered 2015-05-02: 4 mg via INTRAVENOUS

## 2015-05-02 MED ORDER — SUCCINYLCHOLINE CHLORIDE 20 MG/ML IJ SOLN
INTRAMUSCULAR | Status: DC | PRN
  Administered 2015-05-02: 100 mg via INTRAVENOUS

## 2015-05-02 MED ORDER — MEGESTROL ACETATE 400 MG/10ML PO SUSP
400.0000 mg | Freq: Every day | ORAL | Status: DC
  Filled 2015-05-02: qty 10

## 2015-05-02 MED ORDER — LIDOCAINE HCL (CARDIAC) 20 MG/ML IV SOLN
INTRAVENOUS | Status: AC
  Filled 2015-05-02: qty 5

## 2015-05-02 MED ORDER — POTASSIUM CHLORIDE IN NACL 20-0.45 MEQ/L-% IV SOLN
INTRAVENOUS | Status: DC
  Administered 2015-05-03: 1000 mL via INTRAVENOUS
  Administered 2015-05-04 – 2015-05-06 (×3): via INTRAVENOUS
  Filled 2015-05-02 (×9): qty 1000

## 2015-05-02 MED ORDER — HYDROMORPHONE HCL 1 MG/ML IJ SOLN
INTRAMUSCULAR | Status: AC
  Filled 2015-05-02: qty 1

## 2015-05-02 MED ORDER — ONDANSETRON HCL 4 MG PO TABS: 4.0000 mg | ORAL_TABLET | Freq: Four times a day (QID) | ORAL | Status: DC | PRN

## 2015-05-02 MED ORDER — METHOCARBAMOL 500 MG PO TABS
500.0000 mg | ORAL_TABLET | Freq: Four times a day (QID) | ORAL | Status: DC | PRN
  Administered 2015-05-03 – 2015-05-06 (×3): 500 mg via ORAL
  Filled 2015-05-02 (×3): qty 1

## 2015-05-02 MED ORDER — ROCURONIUM BROMIDE 50 MG/5ML IV SOLN
INTRAVENOUS | Status: AC
  Filled 2015-05-02: qty 1

## 2015-05-02 MED ORDER — DIPHENHYDRAMINE HCL 25 MG PO CAPS: 25.0000 mg | ORAL_CAPSULE | Freq: Four times a day (QID) | ORAL | Status: DC | PRN

## 2015-05-02 MED ORDER — MAGNESIUM CITRATE PO SOLN: 1.0000 | Freq: Once | ORAL | Status: DC | PRN

## 2015-05-02 MED ORDER — OXYCODONE HCL 5 MG PO TABS
5.0000 mg | ORAL_TABLET | ORAL | Status: DC | PRN
  Administered 2015-05-03 – 2015-05-06 (×19): 10 mg via ORAL
  Filled 2015-05-02 (×18): qty 2

## 2015-05-02 MED ORDER — QUETIAPINE FUMARATE 25 MG PO TABS
25.0000 mg | ORAL_TABLET | Freq: Every day | ORAL | Status: DC
  Filled 2015-05-02 (×2): qty 1

## 2015-05-02 MED ORDER — BISACODYL 10 MG RE SUPP: 10.0000 mg | Freq: Every day | RECTAL | Status: DC | PRN

## 2015-05-02 MED ORDER — METHOCARBAMOL 1000 MG/10ML IJ SOLN
500.0000 mg | Freq: Four times a day (QID) | INTRAVENOUS | Status: DC | PRN
  Filled 2015-05-02: qty 5

## 2015-05-02 MED ORDER — LEVOFLOXACIN IN D5W 750 MG/150ML IV SOLN
750.0000 mg | INTRAVENOUS | Status: DC
  Administered 2015-05-02 – 2015-05-05 (×3): 750 mg via INTRAVENOUS
  Filled 2015-05-02 (×5): qty 150

## 2015-05-02 MED ORDER — VITAMIN C 500 MG PO TABS
1000.0000 mg | ORAL_TABLET | Freq: Every day | ORAL | Status: DC
  Administered 2015-05-03 – 2015-05-06 (×3): 1000 mg via ORAL
  Filled 2015-05-02 (×3): qty 2

## 2015-05-02 MED ORDER — DOCUSATE SODIUM 100 MG PO CAPS
100.0000 mg | ORAL_CAPSULE | Freq: Two times a day (BID) | ORAL | Status: DC
  Administered 2015-05-03 – 2015-05-06 (×5): 100 mg via ORAL
  Filled 2015-05-02 (×5): qty 1

## 2015-05-02 MED ORDER — FENTANYL CITRATE (PF) 250 MCG/5ML IJ SOLN
INTRAMUSCULAR | Status: AC
  Filled 2015-05-02: qty 5

## 2015-05-02 MED ORDER — DEXAMETHASONE SODIUM PHOSPHATE 10 MG/ML IJ SOLN
INTRAMUSCULAR | Status: DC | PRN
  Administered 2015-05-02: 10 mg via INTRAVENOUS

## 2015-05-02 MED ORDER — PROMETHAZINE HCL 25 MG RE SUPP: 12.5000 mg | Freq: Four times a day (QID) | RECTAL | Status: DC | PRN

## 2015-05-02 MED ORDER — SODIUM CHLORIDE FLUSH 0.9 % IV SOLN
INTRAVENOUS | Status: AC
  Filled 2015-05-02: qty 20

## 2015-05-02 MED ORDER — FAMOTIDINE 20 MG PO TABS: 20.0000 mg | ORAL_TABLET | Freq: Two times a day (BID) | ORAL | Status: DC | PRN

## 2015-05-02 MED ORDER — SODIUM CHLORIDE 0.9 % IR SOLN
Status: DC | PRN
  Administered 2015-05-02: 1000 mL
  Administered 2015-05-02: 3000 mL

## 2015-05-02 MED ORDER — FENTANYL CITRATE (PF) 250 MCG/5ML IJ SOLN
INTRAMUSCULAR | Status: DC | PRN
  Administered 2015-05-02: 100 ug via INTRAVENOUS
  Administered 2015-05-02: 50 ug via INTRAVENOUS
  Administered 2015-05-02: 100 ug via INTRAVENOUS

## 2015-05-02 SURGICAL SUPPLY — 45 items
BANDAGE ACE 3X5.8 VEL STRL LF (GAUZE/BANDAGES/DRESSINGS) ×3 IMPLANT
BANDAGE ACE 4X5 VEL STRL LF (GAUZE/BANDAGES/DRESSINGS) ×3 IMPLANT
BANDAGE ELASTIC 4 VELCRO ST LF (GAUZE/BANDAGES/DRESSINGS) IMPLANT
BNDG CONFORM 2 STRL LF (GAUZE/BANDAGES/DRESSINGS) IMPLANT
BNDG GAUZE ELAST 4 BULKY (GAUZE/BANDAGES/DRESSINGS) ×3 IMPLANT
CORDS BIPOLAR (ELECTRODE) ×3 IMPLANT
CUFF TOURNIQUET SINGLE 18IN (TOURNIQUET CUFF) ×3 IMPLANT
CUFF TOURNIQUET SINGLE 24IN (TOURNIQUET CUFF) IMPLANT
DRSG ADAPTIC 3X8 NADH LF (GAUZE/BANDAGES/DRESSINGS) ×3 IMPLANT
GAUZE SPONGE 4X4 12PLY STRL (GAUZE/BANDAGES/DRESSINGS) IMPLANT
GAUZE XEROFORM 1X8 LF (GAUZE/BANDAGES/DRESSINGS) IMPLANT
GAUZE XEROFORM 5X9 LF (GAUZE/BANDAGES/DRESSINGS) ×3 IMPLANT
GLOVE BIOGEL M STRL SZ7.5 (GLOVE) IMPLANT
GLOVE SS BIOGEL STRL SZ 8 (GLOVE) ×1 IMPLANT
GLOVE SUPERSENSE BIOGEL SZ 8 (GLOVE) ×2
GOWN STRL REUS W/ TWL LRG LVL3 (GOWN DISPOSABLE) ×1 IMPLANT
GOWN STRL REUS W/ TWL XL LVL3 (GOWN DISPOSABLE) ×1 IMPLANT
GOWN STRL REUS W/TWL LRG LVL3 (GOWN DISPOSABLE) ×2
GOWN STRL REUS W/TWL XL LVL3 (GOWN DISPOSABLE) ×2
HANDPIECE INTERPULSE COAX TIP (DISPOSABLE)
KIT BASIN OR (CUSTOM PROCEDURE TRAY) ×3 IMPLANT
KIT ROOM TURNOVER OR (KITS) ×3 IMPLANT
MANIFOLD NEPTUNE II (INSTRUMENTS) ×3 IMPLANT
NEEDLE HYPO 25GX1X1/2 BEV (NEEDLE) IMPLANT
NS IRRIG 1000ML POUR BTL (IV SOLUTION) ×3 IMPLANT
PACK ORTHO EXTREMITY (CUSTOM PROCEDURE TRAY) ×3 IMPLANT
PAD ARMBOARD 7.5X6 YLW CONV (MISCELLANEOUS) ×3 IMPLANT
PAD CAST 3X4 CTTN HI CHSV (CAST SUPPLIES) ×1 IMPLANT
PAD CAST 4YDX4 CTTN HI CHSV (CAST SUPPLIES) ×1 IMPLANT
PADDING CAST ABS 4INX4YD NS (CAST SUPPLIES) ×2
PADDING CAST ABS COTTON 4X4 ST (CAST SUPPLIES) ×1 IMPLANT
PADDING CAST COTTON 3X4 STRL (CAST SUPPLIES) ×2
PADDING CAST COTTON 4X4 STRL (CAST SUPPLIES) ×2
SET HNDPC FAN SPRY TIP SCT (DISPOSABLE) IMPLANT
SPLINT FIBERGLASS 3X12 (CAST SUPPLIES) ×3 IMPLANT
SPONGE GAUZE 4X4 12PLY STER LF (GAUZE/BANDAGES/DRESSINGS) ×3 IMPLANT
SPONGE LAP 4X18 X RAY DECT (DISPOSABLE) ×3 IMPLANT
SYR CONTROL 10ML LL (SYRINGE) IMPLANT
TOWEL OR 17X24 6PK STRL BLUE (TOWEL DISPOSABLE) ×3 IMPLANT
TOWEL OR 17X26 10 PK STRL BLUE (TOWEL DISPOSABLE) ×3 IMPLANT
TUBE ANAEROBIC SPECIMEN COL (MISCELLANEOUS) ×3 IMPLANT
TUBE CONNECTING 12'X1/4 (SUCTIONS) ×1
TUBE CONNECTING 12X1/4 (SUCTIONS) ×2 IMPLANT
WATER STERILE IRR 1000ML POUR (IV SOLUTION) ×3 IMPLANT
YANKAUER SUCT BULB TIP NO VENT (SUCTIONS) ×3 IMPLANT

## 2015-05-02 NOTE — Op Note (Signed)
[name], [name] NO.:  [id number]  MEDICAL RECORD NO.:  [id number]  LOCATION:  [facility]                         FACILITY:  [facility]  PHYSICIAN:  Dionne Ano. Marimar Suber, M.D.DATE OF BIRTH:  February 29, 1986  DATE OF PROCEDURE: DATE OF DISCHARGE:                              OPERATIVE REPORT   PREOPERATIVE DIAGNOSIS:  Dorsal and volar dog bite injury greater than 24 hours ago with infectious tenosynovitis dorsally and volarly (flexor and extensor apparatus).  POSTOPERATIVE DIAGNOSIS:  Dorsal and volar dog bite injury greater than 24 hours ago with infectious tenosynovitis dorsally and volarly (flexor and extensor apparatus).  PROCEDURE: 1. Irrigation and debridement, left dorsal wrist abscess. 2. Radical tenosynovectomy left dorsal wrist secondary to     tenosynovitis after dog bite, infectious in nature with complete     evaluation and decompression of the fourth, third, and second     dorsal compartments with noted infectious process about the tendon     apparatus. 3. Open left carpal tunnel release. 4. Median nerve neurolysis and exploration. 5. Radical tenosynovectomy flexor apparatus secondary to infectious     process.  SURGEON:  [name], M.D.  ASSISTANT:  None.  COMPLICATION:  None.  ANESTHESIA:  General.  TOURNIQUET TIME:  Less than an hour.  INDICATIONS:  The patient is a pleasant female presents with above- mentioned diagnosis.  I have counseled her regard the risks and benefits of surgery and she desires to proceed.  OPERATION IN DETAIL:  The patient was seen by myself and Anesthesia, taken to operative suite, underwent smooth induction of general anesthesia, she was prepped and draped in usual sterile fashion with the Hibiclens pre scrub followed by Betadine scrub and paint.  Once this was complete, the patient then underwent a very careful and cautious approach with incision dorsally.  Immediately I encountered infectious fluid.  This  was cultured for aerobic and anaerobic cultures.  I then dissected down and performed an evaluation.  The third dorsal compartment was encroached upon as was the second.  There was a large amount of infectious fluid.  I decompressed the third, second, and fourth dorsal compartments dorsally and performed radical tenosynovectomy.  Thus, I and D of the deep abscess about the dorsal wrist, left upper extremity, and a radical tenosynovectomy of the dorsal wrist was accomplished without difficulty and the patient tolerated this well.  Following this, attention was turned to volarly.  Volarly, the patient had an incision made and extended open.  Carpal tunnel approach was made given the infectious process.  The patient had cultures taken of course with the original cultures in this area and there was infectious fluid. At this time, I performed a median nerve neurolysis overlying the bite mark and then performed an open carpal tunnel release followed by flexor tenolysis, tenosynovectomy, extensive in nature.  I released the volar antebrachial fascia to my satisfaction without difficulty and there were no complicating features.  We then irrigated with 3 L through and through the wounds.  A large amount of saline was placed in the area with cysto tubing aiding the debridement.  I then tacked the carpal tunnel incision closed and placed a vessel loop drains  volarly and dorsally.  The dorsal wound was left open.  I will return to the operative theater in 48 hours for repeat, tenolysis, tenosynovectomy, and wound closure.  Conditions looked good. We will continue the clindamycin and Levaquin given her penicillin allergy, which is anaphylactic.  She was dressed with Adaptic, Xeroform, 4 x 4 gauze, Kerlix, Webril, and a volar plaster splint.  She tolerated this well.  There were no complicating features.  She will be admitted and will try to make sure she has good pain control and alleviate  her infections.     [name], M.D.     [facility]  D:  05/02/2015  T:  05/02/2015  Job:  [id number]

## 2015-05-02 NOTE — Anesthesia Preprocedure Evaluation (Addendum)
Anesthesia Evaluation  Patient identified by MRN, date of birth, ID band Patient awake    Reviewed: Allergy & Precautions, NPO status , Patient's Chart, lab work & pertinent test results  Airway Mallampati: I  TM Distance: >3 FB Neck ROM: Full    Dental  (+) Teeth Intact, Dental Advisory Given   Pulmonary asthma , Current Smoker,    breath sounds clear to auscultation       Cardiovascular  Rhythm:Regular Rate:Normal     Neuro/Psych  Headaches, Anxiety Depression    GI/Hepatic PUD,   Endo/Other    Renal/GU      Musculoskeletal   Abdominal   Peds  Hematology   Anesthesia Other Findings   Reproductive/Obstetrics                           Anesthesia Physical Anesthesia Plan  ASA: II and emergent  Anesthesia Plan: General   Post-op Pain Management:    Induction: Intravenous, Rapid sequence and Cricoid pressure planned  Airway Management Planned: Oral ETT  Additional Equipment:   Intra-op Plan:   Post-operative Plan: Extubation in OR  Informed Consent: I have reviewed the patients History and Physical, chart, labs and discussed the procedure including the risks, benefits and alternatives for the proposed anesthesia with the patient or authorized representative who has indicated his/her understanding and acceptance.   Dental advisory given  Plan Discussed with: Anesthesiologist, Surgeon and CRNA  Anesthesia Plan Comments:        Anesthesia Quick Evaluation
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
## 2015-05-02 NOTE — Anesthesia Procedure Notes (Signed)
Procedure Name: Intubation Date/Time: 05/02/2015 9:31 PM Performed by: Molli Hazard Pre-anesthesia Checklist: Patient identified, Emergency Drugs available, Suction available and Patient being monitored Patient Re-evaluated:Patient Re-evaluated prior to inductionOxygen Delivery Method: Circle system utilized Preoxygenation: Pre-oxygenation with 100% oxygen Intubation Type: IV induction, Rapid sequence and Cricoid Pressure applied Laryngoscope Size: Miller and 2 Grade View: Grade I Tube type: Oral Tube size: 7.5 mm Number of attempts: 1 Airway Equipment and Method: Stylet Placement Confirmation: ETT inserted through vocal cords under direct vision,  positive ETCO2 and breath sounds checked- equal and bilateral Secured at: 22 cm Tube secured with: Tape Dental Injury: Teeth and Oropharynx as per pre-operative assessment

## 2015-05-02 NOTE — Op Note (Signed)
See dictation#[id number] Nyomi Howser MD

## 2015-05-02 NOTE — Progress Notes (Signed)
D; pt is so emotional, crying, sister and mother at bedside. Moved to 5N 29.via stretcher. Additional report given to [facility]. Said anxiety and hyper ventilation. Sister Huntley Dec at bedside , console her.

## 2015-05-02 NOTE — Progress Notes (Signed)
Report rec'd from PACU nurse. Awaiting patient's arrival.

## 2015-05-02 NOTE — ED Notes (Signed)
MD at bedside. 

## 2015-05-02 NOTE — ED Notes (Signed)
Pt was bit by sisters dog yesterday. Pt has puncture on top of forearm and wrist. Redness and swelling noted to left wrist. Pt states dog is update on rabies.

## 2015-05-02 NOTE — Anesthesia Postprocedure Evaluation (Signed)
Anesthesia Post Note  Patient: [name]  Procedure(s) Performed: Procedure(s) (LRB): IRRIGATION AND DEBRIDEMENT LEFT WRIST (Left)  Patient location during evaluation: PACU Anesthesia Type: General Level of consciousness: awake and alert Pain management: pain level controlled Vital Signs Assessment: post-procedure vital signs reviewed and stable Respiratory status: spontaneous breathing, nonlabored ventilation, respiratory function stable and patient connected to nasal cannula oxygen Cardiovascular status: blood pressure returned to baseline and stable Postop Assessment: no signs of nausea or vomiting Anesthetic complications: no    Last Vitals:  Filed Vitals:   05/02/15 2256 05/02/15 2300  BP: 124/82   Pulse: 92 86  Temp: 36.4 C   Resp: 17 14    Last Pain:  Filed Vitals:   05/02/15 2325  PainSc: Asleep                 Terra Aveni,W. EDMOND

## 2015-05-02 NOTE — Transfer of Care (Signed)
Immediate Anesthesia Transfer of Care Note  Patient: [name]  Procedure(s) Performed: Procedure(s): IRRIGATION AND DEBRIDEMENT LEFT WRIST (Left)  Patient Location: PACU  Anesthesia Type:General  Level of Consciousness: awake, alert  and confused  Airway & Oxygen Therapy: Patient Spontanous Breathing  Post-op Assessment: Report given to RN, Post -op Vital signs reviewed and stable and Patient moving all extremities X 4  Post vital signs: Reviewed and stable  Last Vitals:  Filed Vitals:   05/02/15 1804 05/02/15 2024  BP: 133/72 118/92  Pulse: 99 84  Temp: 36.8 C   Resp: 16 16    Complications: No apparent anesthesia complications
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
## 2015-05-03 ENCOUNTER — Encounter (HOSPITAL_COMMUNITY): Payer: Self-pay | Admitting: *Deleted

## 2015-05-03 MED ORDER — SERTRALINE HCL 50 MG PO TABS
50.0000 mg | ORAL_TABLET | Freq: Every day | ORAL | Status: DC
  Administered 2015-05-04 – 2015-05-06 (×3): 50 mg via ORAL
  Filled 2015-05-03 (×3): qty 1

## 2015-05-03 MED ORDER — INFLUENZA VAC SPLIT QUAD 0.5 ML IM SUSY
0.5000 mL | PREFILLED_SYRINGE | INTRAMUSCULAR | Status: DC
Start: 2015-05-04 — End: 2015-05-06
  Filled 2015-05-03: qty 0.5

## 2015-05-03 MED ORDER — PNEUMOCOCCAL VAC POLYVALENT 25 MCG/0.5ML IJ INJ
0.5000 mL | INJECTION | INTRAMUSCULAR | Status: DC
  Filled 2015-05-03: qty 0.5

## 2015-05-03 NOTE — Progress Notes (Signed)
Utilization review completed.  

## 2015-05-03 NOTE — Progress Notes (Signed)
Patient ID: [name], female   DOB: 1986/03/09, 30 y.o.   MRN: [id number] Patient is alert and oriented.  Patient will be monitored today.  We'll plan for elevation range of motion.  I discussed with the patient a return to the operative theater tomorrow for irrigation debridement and possible complete wound closure versus incomplete wound closure. We will await her cultures. This was rather significant infection secondary to a dog bite wound.  Given her penicillin allergy we will continue the above-mentioned antibiotic regime of Levaquin and clindamycin  We have discussed with the patient the issues regarding their infection to the extremity. We will continue antibiotics and await culture results. Often times it will take 3-5 days for cultures to become final. During this time we will typically have the patient on intravenous antibiotics until we can find a parenteral route of antibiotic regime specific for the bacteria or organism isolated. We have discussed with the patient the need for daily irrigation and debridement as well as therapy to the area. We have discussed with the patient the necessity of range of motion to the involved joints as discussed today. We have discussed with the patient the unpredictability of infections at times. We'll continue to work towards good pain control and restoration of function. The patient understands the need for meticulous wound care and the necessity of proper followup.  The possible complications of stiffness (loss of motion), resistant infection, possible deep bone infection, possible chronic pain issues, possible need for multiple surgeries and even amputation.  With this in mind the patient understands our goal is to eradicate the infection to quiesence. We will continue to work towards these goals.  We are planning surgery for your upper extremity. The risk and benefits of surgery to include risk of bleeding, infection, anesthesia,  damage to normal  structures and failure of the surgery to accomplish its intended goals of relieving symptoms and restoring function have been discussed in detail. With this in mind we plan to proceed. I have specifically discussed with the patient the pre-and postoperative regime and the dos and don'ts and risk and benefits in great detail. Risk and benefits of surgery also include risk of dystrophy(CRPS), chronic nerve pain, failure of the healing process to go onto completion and other inherent risks of surgery The relavent the pathophysiology of the disease/injury process, as well as the alternatives for treatment and postoperative course of action has been discussed in great detail with the patient who desires to proceed.  We will do everything in our power to help you (the patient) restore function to the upper extremity. It is a pleasure to see this patient today. Wille Aubuchon MD

## 2015-05-03 NOTE — Care Management Note (Signed)
Case Management Note  Patient Details  Name: [name] MRN: [id number] Date of Birth: [birth year]  Subjective/Objective:         Admitted with infected left wrist from dog bite, s/p I and D, repeat  I and D scheduled for 05/04/15           Action/Plan: Patient lives with mom, will follow for possible medication assistance and any other d/c needs postop.  Expected Discharge Date:  05/05/15               Expected Discharge Plan:     In-House Referral:  Financial Counselor  Discharge planning Services  CM Consult  Post Acute Care Choice:    Choice offered to:     DME Arranged:    DME Agency:     HH Arranged:    HH Agency:     Status of Service:  In process, will continue to follow  Medicare Important Message Given:    Date Medicare IM Given:    Medicare IM give by:    Date Additional Medicare IM Given:    Additional Medicare Important Message give by:     If discussed at Long Length of Stay Meetings, dates discussed:    Additional Comments:  Monica Becton, RN 05/03/2015, 4:15 PM
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
## 2015-05-04 ENCOUNTER — Encounter (HOSPITAL_COMMUNITY)
Admission: EM | Disposition: A | Discharge status: Home / Self Care | Payer: Self-pay | Source: Home / Self Care | Attending: Orthopedic Surgery

## 2015-05-04 ENCOUNTER — Inpatient Hospital Stay (HOSPITAL_COMMUNITY): Payer: Self-pay | Admitting: Certified Registered Nurse Anesthetist

## 2015-05-04 ENCOUNTER — Encounter (HOSPITAL_COMMUNITY): Payer: Self-pay | Admitting: Orthopedic Surgery

## 2015-05-04 ENCOUNTER — Inpatient Hospital Stay (HOSPITAL_COMMUNITY): Payer: MEDICAID | Admitting: Certified Registered Nurse Anesthetist

## 2015-05-04 HISTORY — PX: I & D EXTREMITY: SHX5045

## 2015-05-04 LAB — SURGICAL PCR SCREEN
MRSA, PCR: NEGATIVE
STAPHYLOCOCCUS AUREUS: NEGATIVE

## 2015-05-04 SURGERY — IRRIGATION AND DEBRIDEMENT EXTREMITY
Anesthesia: General | Site: Wrist | Laterality: Left

## 2015-05-04 MED ORDER — MIDAZOLAM HCL 2 MG/2ML IJ SOLN
INTRAMUSCULAR | Status: AC
  Filled 2015-05-04: qty 2

## 2015-05-04 MED ORDER — SODIUM CHLORIDE 0.9 % IR SOLN
Status: DC | PRN
  Administered 2015-05-04: 3000 mL
  Administered 2015-05-04: 1000 mL
  Administered 2015-05-04: 3000 mL

## 2015-05-04 MED ORDER — ONDANSETRON HCL 4 MG/2ML IJ SOLN
INTRAMUSCULAR | Status: AC
  Filled 2015-05-04: qty 4

## 2015-05-04 MED ORDER — FENTANYL CITRATE (PF) 100 MCG/2ML IJ SOLN
INTRAMUSCULAR | Status: DC | PRN
  Administered 2015-05-04: 150 ug via INTRAVENOUS
  Administered 2015-05-04: 100 ug via INTRAVENOUS

## 2015-05-04 MED ORDER — HYDROMORPHONE HCL 1 MG/ML IJ SOLN
INTRAMUSCULAR | Status: AC
  Administered 2015-05-04: 0.5 mg via INTRAVENOUS
  Filled 2015-05-04: qty 1

## 2015-05-04 MED ORDER — PROPOFOL 10 MG/ML IV BOLUS
INTRAVENOUS | Status: DC | PRN
  Administered 2015-05-04: 150 mg via INTRAVENOUS

## 2015-05-04 MED ORDER — ALPRAZOLAM 0.5 MG PO TABS
1.0000 mg | ORAL_TABLET | Freq: Three times a day (TID) | ORAL | Status: DC | PRN
  Administered 2015-05-05 – 2015-05-06 (×4): 1 mg via ORAL
  Filled 2015-05-04 (×4): qty 2

## 2015-05-04 MED ORDER — MIDAZOLAM HCL 5 MG/5ML IJ SOLN
INTRAMUSCULAR | Status: DC | PRN
  Administered 2015-05-04: 4 mg via INTRAVENOUS

## 2015-05-04 MED ORDER — LACTATED RINGERS IV SOLN
INTRAVENOUS | Status: DC
  Administered 2015-05-04: 18:00:00 via INTRAVENOUS

## 2015-05-04 MED ORDER — LACTATED RINGERS IV SOLN
INTRAVENOUS | Status: DC | PRN
  Administered 2015-05-04: 20:00:00 via INTRAVENOUS

## 2015-05-04 MED ORDER — LIDOCAINE HCL (CARDIAC) 20 MG/ML IV SOLN
INTRAVENOUS | Status: DC | PRN
  Administered 2015-05-04: 70 mg via INTRAVENOUS

## 2015-05-04 MED ORDER — SERTRALINE HCL 50 MG PO TABS: 50.0000 mg | ORAL_TABLET | Freq: Every day | ORAL | Status: DC

## 2015-05-04 MED ORDER — FENTANYL CITRATE (PF) 250 MCG/5ML IJ SOLN
INTRAMUSCULAR | Status: AC
  Filled 2015-05-04: qty 5

## 2015-05-04 MED ORDER — HYDROMORPHONE HCL 1 MG/ML IJ SOLN
0.2500 mg | INTRAMUSCULAR | Status: DC | PRN
  Administered 2015-05-04 (×2): 0.5 mg via INTRAVENOUS

## 2015-05-04 MED ORDER — OXYCODONE HCL 5 MG PO TABS
ORAL_TABLET | ORAL | Status: AC
  Administered 2015-05-04: 10 mg via ORAL
  Filled 2015-05-04: qty 2

## 2015-05-04 MED ORDER — ONDANSETRON HCL 4 MG/2ML IJ SOLN
INTRAMUSCULAR | Status: DC | PRN
  Administered 2015-05-04: 4 mg via INTRAVENOUS

## 2015-05-04 MED ORDER — PROMETHAZINE HCL 25 MG/ML IJ SOLN: 6.2500 mg | INTRAMUSCULAR | Status: DC | PRN

## 2015-05-04 SURGICAL SUPPLY — 41 items
BANDAGE ELASTIC 4 VELCRO ST LF (GAUZE/BANDAGES/DRESSINGS) ×3 IMPLANT
BNDG CONFORM 2 STRL LF (GAUZE/BANDAGES/DRESSINGS) IMPLANT
BNDG GAUZE ELAST 4 BULKY (GAUZE/BANDAGES/DRESSINGS) ×3 IMPLANT
CORDS BIPOLAR (ELECTRODE) ×3 IMPLANT
CUFF TOURNIQUET SINGLE 18IN (TOURNIQUET CUFF) ×3 IMPLANT
CUFF TOURNIQUET SINGLE 24IN (TOURNIQUET CUFF) IMPLANT
DRSG ADAPTIC 3X8 NADH LF (GAUZE/BANDAGES/DRESSINGS) ×3 IMPLANT
GAUZE SPONGE 4X4 12PLY STRL (GAUZE/BANDAGES/DRESSINGS) ×3 IMPLANT
GAUZE XEROFORM 1X8 LF (GAUZE/BANDAGES/DRESSINGS) ×3 IMPLANT
GLOVE BIOGEL M STRL SZ7.5 (GLOVE) ×3 IMPLANT
GLOVE BIOGEL PI IND STRL 6.5 (GLOVE) ×1 IMPLANT
GLOVE BIOGEL PI INDICATOR 6.5 (GLOVE) ×2
GLOVE ECLIPSE 6.5 STRL STRAW (GLOVE) ×3 IMPLANT
GLOVE SS BIOGEL STRL SZ 8 (GLOVE) ×1 IMPLANT
GLOVE SUPERSENSE BIOGEL SZ 8 (GLOVE) ×2
GOWN STRL REUS W/ TWL LRG LVL3 (GOWN DISPOSABLE) ×1 IMPLANT
GOWN STRL REUS W/ TWL XL LVL3 (GOWN DISPOSABLE) ×2 IMPLANT
GOWN STRL REUS W/TWL LRG LVL3 (GOWN DISPOSABLE) ×2
GOWN STRL REUS W/TWL XL LVL3 (GOWN DISPOSABLE) ×4
HANDPIECE INTERPULSE COAX TIP (DISPOSABLE)
KIT BASIN OR (CUSTOM PROCEDURE TRAY) ×3 IMPLANT
KIT ROOM TURNOVER OR (KITS) ×3 IMPLANT
MANIFOLD NEPTUNE II (INSTRUMENTS) ×3 IMPLANT
NEEDLE HYPO 25GX1X1/2 BEV (NEEDLE) IMPLANT
NS IRRIG 1000ML POUR BTL (IV SOLUTION) ×3 IMPLANT
PACK ORTHO EXTREMITY (CUSTOM PROCEDURE TRAY) ×3 IMPLANT
PAD ARMBOARD 7.5X6 YLW CONV (MISCELLANEOUS) ×3 IMPLANT
PAD CAST 4YDX4 CTTN HI CHSV (CAST SUPPLIES) ×2 IMPLANT
PADDING CAST COTTON 4X4 STRL (CAST SUPPLIES) ×4
SET HNDPC FAN SPRY TIP SCT (DISPOSABLE) IMPLANT
SPLINT FIBERGLASS 3X12 (CAST SUPPLIES) ×3 IMPLANT
SPONGE LAP 4X18 X RAY DECT (DISPOSABLE) ×3 IMPLANT
SUT PROLENE 4 0 PS 2 18 (SUTURE) ×6 IMPLANT
SYR CONTROL 10ML LL (SYRINGE) IMPLANT
TOWEL OR 17X24 6PK STRL BLUE (TOWEL DISPOSABLE) ×3 IMPLANT
TOWEL OR 17X26 10 PK STRL BLUE (TOWEL DISPOSABLE) ×3 IMPLANT
TUBE ANAEROBIC SPECIMEN COL (MISCELLANEOUS) IMPLANT
TUBE CONNECTING 12'X1/4 (SUCTIONS) ×1
TUBE CONNECTING 12X1/4 (SUCTIONS) ×2 IMPLANT
WATER STERILE IRR 1000ML POUR (IV SOLUTION) ×3 IMPLANT
YANKAUER SUCT BULB TIP NO VENT (SUCTIONS) ×3 IMPLANT

## 2015-05-04 NOTE — Progress Notes (Signed)
Report called to short stay. 

## 2015-05-04 NOTE — Transfer of Care (Signed)
Immediate Anesthesia Transfer of Care Note  Patient: [name]  Procedure(s) Performed: Procedure(s): REPEAT IRRIGATION AND DEBRIDEMENT LEFT WRIST (Left)  Patient Location: PACU  Anesthesia Type:General  Level of Consciousness: awake, alert , pateint uncooperative and confused  Airway & Oxygen Therapy: Patient Spontanous Breathing and Patient connected to nasal cannula oxygen  Post-op Assessment: Report given to RN, Post -op Vital signs reviewed and stable and Patient moving all extremities  Post vital signs: Reviewed and stable  Last Vitals:  Filed Vitals:   05/04/15 1740 05/04/15 2033  BP: 131/99   Pulse: 92   Temp: 37.4 C 37.2 C  Resp: 16     Complications: No apparent anesthesia complications

## 2015-05-04 NOTE — Progress Notes (Signed)
Patient ID: [name], female   DOB: [birth year], 29 y.o.   MRN: [id number] Patient seen and examined  We'll plan for repeat I and D today.  Cultures pending.  All questions encouraged and answered.  We are planning surgery for your upper extremity. The risk and benefits of surgery to include risk of bleeding, infection, anesthesia,  damage to normal structures and failure of the surgery to accomplish its intended goals of relieving symptoms and restoring function have been discussed in detail. With this in mind we plan to proceed. I have specifically discussed with the patient the pre-and postoperative regime and the dos and don'ts and risk and benefits in great detail. Risk and benefits of surgery also include risk of dystrophy(CRPS), chronic nerve pain, failure of the healing process to go onto completion and other inherent risks of surgery The relavent the pathophysiology of the disease/injury process, as well as the alternatives for treatment and postoperative course of action has been discussed in great detail with the patient who desires to proceed.  We will do everything in our power to help you (the patient) restore function to the upper extremity. It is a pleasure to see this patient today. Tien Spooner M.D.

## 2015-05-04 NOTE — Anesthesia Preprocedure Evaluation (Signed)
Anesthesia Evaluation  Patient identified by MRN, date of birth, ID band Patient awake    Reviewed: Allergy & Precautions, NPO status , Patient's Chart, lab work & pertinent test results  Airway Mallampati: II  TM Distance: >3 FB Neck ROM: Full    Dental no notable dental hx.    Pulmonary asthma , Current Smoker,    Pulmonary exam normal breath sounds clear to auscultation       Cardiovascular negative cardio ROS Normal cardiovascular exam Rhythm:Regular Rate:Normal     Neuro/Psych negative neurological ROS  negative psych ROS   GI/Hepatic negative GI ROS, Neg liver ROS,   Endo/Other  negative endocrine ROS  Renal/GU negative Renal ROS  negative genitourinary   Musculoskeletal negative musculoskeletal ROS (+)   Abdominal   Peds negative pediatric ROS (+)  Hematology negative hematology ROS (+)   Anesthesia Other Findings   Reproductive/Obstetrics negative OB ROS                             Anesthesia Physical Anesthesia Plan  ASA: II  Anesthesia Plan: General   Post-op Pain Management:    Induction: Intravenous  Airway Management Planned: LMA  Additional Equipment:   Intra-op Plan:   Post-operative Plan: Extubation in OR  Informed Consent: I have reviewed the patients History and Physical, chart, labs and discussed the procedure including the risks, benefits and alternatives for the proposed anesthesia with the patient or authorized representative who has indicated his/her understanding and acceptance.   Dental advisory given  Plan Discussed with: CRNA and Surgeon  Anesthesia Plan Comments:         Anesthesia Quick Evaluation

## 2015-05-04 NOTE — Progress Notes (Signed)
Report given to Jo, RN.

## 2015-05-04 NOTE — Anesthesia Procedure Notes (Signed)
Procedure Name: LMA Insertion Date/Time: 05/04/2015 7:36 PM Performed by: Orvilla Fus A Pre-anesthesia Checklist: Patient identified, Emergency Drugs available, Suction available, Patient being monitored and Timeout performed Patient Re-evaluated:Patient Re-evaluated prior to inductionOxygen Delivery Method: Circle system utilized Preoxygenation: Pre-oxygenation with 100% oxygen Intubation Type: IV induction LMA: LMA inserted LMA Size: 4.0 Number of attempts: 1 Tube secured with: Tape Dental Injury: Teeth and Oropharynx as per pre-operative assessment

## 2015-05-04 NOTE — Op Note (Signed)
See dict #[id number] Lori Pea MD

## 2015-05-04 NOTE — Op Note (Signed)
NAMEAUDRY, [name] NO.:  [id number]  MEDICAL RECORD NO.:  [id number]  LOCATION:  [facility]                         FACILITY:  [facility]  PHYSICIAN:  [name], M.D.DATE OF BIRTH:  05-29-1985  DATE OF PROCEDURE: DATE OF DISCHARGE:                              OPERATIVE REPORT   PREOPERATIVE DIAGNOSIS:  Status post multiple dog bites, with extensor and flexor tenosynovitis and infectious sequelae.  POSTOPERATIVE DIAGNOSIS:  Status post multiple dog bites, with extensor and flexor tenosynovitis and infectious sequelae.  PROCEDURE: 1. Irrigation and debridement, dorsal wrist, this was a radical     extensor tenosynovectomy of the 4th, 3rd, and 2nd compartments with     excisional debridement and closure over a vessel loop drain. 2. Radical flexor tenosynovectomy of the FDP, FDS tendons excisional     in nature, volar wrist and forearm. 3. Median nerve neurolysis, left volar wrist and forearm.  SURGEON:  [name], MD  ASSISTANT:  None.  COMPLICATION:  None.  ANESTHESIA:  General anesthetic.  TOURNIQUET TIME:  0.  INDICATIONS:  Patient is a pleasant 30 year old female, who presents by missed diagnosis, she has had a prior I and D, with open carpal tunnel release, flexor and extensor tenosynovectomy secondary to infection. She had been on IV antibiotics.  She is noting some improvement.  She is here today for repeat washout.  I have discussed with the patient, risks and benefits, do's and don'ts, timeframe duration of recovery, and other issues remain to her upper extremity predicament at present time.  I am going to plan for repeat I and D, and possible loose closure.  PROCEDURE IN DETAIL:  Patient was seen by myself and Anesthesia, taken to operative suite, general anesthetic was induced.  Time-out observed. She was prepped and draped with Hibiclens scrub, as a pre-scrub followed by 10 minutes surgical Betadine scrub and paint performed by  myself. Arm was elevated.  Tourniquet was not needed.  Sterile drapes were applied and time-out was observed.  Following this, I looked dorsally 1st and performed the extensor tenosynovectomy of the EPL, ECRB, ECRL, and 4th dorsal compartment.  The wrist joint was stable.  We irrigated with 3 L of fluid and performed the excisional debridement, radical tenosynovectomy without difficulty. I ultimately closed this with 3 interrupted Prolene sutures loosely over a vessel loop drain.  Following this, volar wrist was addressed with flexor tenolysis, tenosynovectomy, radical in nature about the FDP, FDS, and FPL tendon. In doing so, we performed a median nerve neurolysis.  Wound conditions were markedly improved.  There was no excessive sites of infection and the tenolysis tenosynovectomy went without difficulty.  Median nerve was intact.  Neurolysis was performed without difficulty. Following this, I irrigated with 3 L of saline and then closed the wound over a blue vessel loop drain.  Following this, she was dressed with Adaptic, Xeroform, and a sterile gauze followed by volar fiberglass splint.  Wound conditions looked excellent.  She is healing nicely.  We will await cultures and of course to make sure that things go smoothly for her.  She is a very nice young lady.  Hopefully, she will go on to a  quiescent state of affair swiftly.  Our plans going forward are to await the final culture results and continue antibiotics as well as postoperative care plans during the recovery.  These notes discussed.  All questions have been encouraged and answered.  Should any problems arise, we will be immediately available.     [name], M.D.     [facility]  D:  05/04/2015  T:  05/04/2015  Job:  [id number]

## 2015-05-05 ENCOUNTER — Encounter (HOSPITAL_COMMUNITY): Payer: Self-pay | Admitting: Orthopedic Surgery

## 2015-05-05 LAB — WOUND CULTURE

## 2015-05-05 NOTE — Progress Notes (Signed)
Patient asked RN to look in her mouth because she felt an abscess forming from her broken tooth. Patient states that the abscess always comes when she gets sick. She states that she is very worried about it and wants to know if she can have surgery. RN told patient she will pass this on to the day RN so that the doctors are aware.

## 2015-05-05 NOTE — Progress Notes (Signed)
Subjective: 1 Day Post-Op Procedure(s) (LRB): REPEAT IRRIGATION AND DEBRIDEMENT LEFT WRIST (Left) Patient reports pain as improved overall. She states that last night the upper extremity was fairly tender. She denies fever or chills this morning. She denies nausea or vomiting.  Objective: Vital signs in last 24 hours: Temp:  [98.7 F (37.1 C)-99.3 F (37.4 C)] 99 F (37.2 C) (02/08 0516) Pulse Rate:  [75-92] 84 (02/08 0516) Resp:  [11-28] 16 (02/08 0516) BP: (105-141)/(60-99) 106/60 mmHg (02/08 0516) SpO2:  [100 %] 100 % (02/08 0516)  Intake/Output from previous day: 02/07 0701 - 02/08 0700 In: 600 [I.V.:600] Out: -  Intake/Output this shift: Total I/O In: 120 [P.O.:120] Out: -    Recent Labs  05/02/15 1948  HGB 11.2*    Recent Labs  05/02/15 1948  WBC 7.3  RBC 3.84*  HCT 33.6*  PLT 237    Recent Labs  05/02/15 1948  NA 142  K 3.7  CL 107  CO2 27  BUN 7  CREATININE 0.60  GLUCOSE 82  CALCIUM 9.1   No results for input(s): LABPT, INR in the last 72 hours. Cultures are currently pending, Gram stain shows no organisms to date Physical examination: She is pleasant, conversant, no acute distress Valuation of the left upper extremity shows that she has good digital range of motion with flexion and extension with mild tenderness present. No signs of cellulitis are present. The drains are removed without difficulty. Sensation refill are intact The patient is alert and oriented in no acute distress. The patient complains of pain in the affected upper extremity.  The patient is noted to have a normal HEENT exam. Lung fields show equal chest expansion and no shortness of breath. Abdomen exam is nontender without distention. Lower extremity examination does not show any fracture dislocation or blood clot symptoms. Pelvis is stable and the neck and back are stable and nontender.  Assessment/Plan: 1 Day Post-Op Procedure(s) (LRB): REPEAT IRRIGATION AND DEBRIDEMENT  LEFT WRIST (Left) We will continue IV antibiotics in the form of clindamycin until final cultures are obtained. In the interim we have discussed with her the importance of elevation, edema control, finger range of motion. She can ambulate in the halls today. Continue close observation. Once final cultures are obtained we will plan to transition her to by mouth antibiotics for home use.  Breniyah Romm L 05/05/2015, 8:42 AM

## 2015-05-06 MED ORDER — OXYCODONE HCL 5 MG PO TABS: 5.0000 mg | ORAL_TABLET | Freq: Four times a day (QID) | ORAL | Status: DC | PRN

## 2015-05-06 MED ORDER — CLINDAMYCIN HCL 300 MG PO CAPS: 300.0000 mg | ORAL_CAPSULE | Freq: Three times a day (TID) | ORAL | Status: DC

## 2015-05-06 MED ORDER — CIPROFLOXACIN HCL 500 MG PO TABS: 500.0000 mg | ORAL_TABLET | Freq: Two times a day (BID) | ORAL | Status: DC

## 2015-05-06 MED ORDER — DOCUSATE SODIUM 100 MG PO CAPS: 100.0000 mg | ORAL_CAPSULE | Freq: Two times a day (BID) | ORAL | Status: DC
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
## 2015-05-06 NOTE — Care Management Note (Signed)
Case Management Note  Patient Details  Name: [name] MRN: [id number] Date of Birth: 05-08-1985  Subjective/Objective:      Admitted with dog bites to left arm, s/p I and D              Action/Plan: Explained MATCH program to patient and to her mom. Gave them MATCH letter and list of participating pharmacies. No home health or DME needs identified by PT. Patient going home with her mom.   Expected Discharge Date:  05/05/15               Expected Discharge Plan:  Home/Self Care  In-House Referral:  Financial Counselor  Discharge planning Services  CM Consult, [facility]  Post Acute Care Choice:  NA Choice offered to:  NA  DME Arranged:  N/A DME Agency:  NA  HH Arranged:  NA HH Agency:  NA  Status of Service:  Completed, signed off  Medicare Important Message Given:    Date Medicare IM Given:    Medicare IM give by:    Date Additional Medicare IM Given:    Additional Medicare Important Message give by:     If discussed at Long Length of Stay Meetings, dates discussed:    Additional Comments:  Monica Becton, RN 05/06/2015, 2:37 PM

## 2015-05-06 NOTE — Progress Notes (Signed)
Pt. Got d/c papers,IV was d/c.Pt. Ready to go home with her mom.

## 2015-05-06 NOTE — Anesthesia Postprocedure Evaluation (Signed)
Anesthesia Post Note  Patient: [name]  Procedure(s) Performed: Procedure(s) (LRB): REPEAT IRRIGATION AND DEBRIDEMENT LEFT WRIST (Left)  Patient location during evaluation: PACU Anesthesia Type: General Level of consciousness: awake and alert Pain management: pain level controlled Vital Signs Assessment: post-procedure vital signs reviewed and stable Respiratory status: spontaneous breathing, nonlabored ventilation, respiratory function stable and patient connected to nasal cannula oxygen Cardiovascular status: blood pressure returned to baseline and stable Postop Assessment: no signs of nausea or vomiting Anesthetic complications: no    Last Vitals:  Filed Vitals:   05/05/15 2003 05/06/15 0637  BP: 109/67 116/62  Pulse: 86 89  Temp: 36.8 C 36.8 C  Resp: 16 16    Last Pain:  Filed Vitals:   05/06/15 0759  PainSc: 9                  Devaughn Savant S

## 2015-05-06 NOTE — Discharge Instructions (Signed)
,.  Keep bandage clean and dry.  Call for any problems.  No smoking.  Criteria for driving a car: you should be off your pain medicine for 7-8 hours, able to drive one handed(confident), thinking clearly and feeling able in your judgement to drive. Continue elevation as it will decrease swelling.  If instructed by MD move your fingers within the confines of the bandage/splint.  Use ice if instructed by your MD. Call immediately for any sudden loss of feeling in your hand/arm or change in functional abilities of the extremity. We recommend that you to take vitamin C 1000 mg a day to promote healing. We also recommend that if you require  pain medicine that you take a stool softener to prevent constipation as most pain medicines will have constipation side effects. We recommend either Peri-Colace or Senokot and recommend that you also consider adding MiraLAX as well to prevent the constipation affects from pain medicine if you are required to use them. These medicines are over the counter and may be purchased at a local pharmacy. A cup of yogurt and a probiotic can also be helpful during the recovery process as the medicines can disrupt your intestinal environment.

## 2015-05-08 LAB — ANAEROBIC CULTURE

## 2015-05-25 ENCOUNTER — Encounter: Payer: Self-pay | Admitting: Occupational Therapy

## 2015-05-25 ENCOUNTER — Ambulatory Visit: Payer: Self-pay | Attending: Orthopedic Surgery | Admitting: Occupational Therapy

## 2015-05-25 DIAGNOSIS — M25642 Stiffness of left hand, not elsewhere classified: Secondary | ICD-10-CM | POA: Insufficient documentation

## 2015-05-25 DIAGNOSIS — R208 Other disturbances of skin sensation: Secondary | ICD-10-CM | POA: Insufficient documentation

## 2015-05-25 DIAGNOSIS — M79642 Pain in left hand: Secondary | ICD-10-CM | POA: Insufficient documentation

## 2015-05-25 DIAGNOSIS — M6289 Other specified disorders of muscle: Secondary | ICD-10-CM | POA: Insufficient documentation

## 2015-05-25 DIAGNOSIS — R29898 Other symptoms and signs involving the musculoskeletal system: Secondary | ICD-10-CM

## 2015-05-25 DIAGNOSIS — M25632 Stiffness of left wrist, not elsewhere classified: Secondary | ICD-10-CM | POA: Insufficient documentation
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
## 2015-06-08 ENCOUNTER — Ambulatory Visit: Payer: No Typology Code available for payment source | Attending: Orthopedic Surgery | Admitting: Occupational Therapy

## 2015-06-08 DIAGNOSIS — M79642 Pain in left hand: Secondary | ICD-10-CM | POA: Insufficient documentation

## 2015-06-08 DIAGNOSIS — M25632 Stiffness of left wrist, not elsewhere classified: Secondary | ICD-10-CM | POA: Insufficient documentation

## 2015-06-08 DIAGNOSIS — R208 Other disturbances of skin sensation: Secondary | ICD-10-CM | POA: Insufficient documentation

## 2015-06-08 DIAGNOSIS — M6289 Other specified disorders of muscle: Secondary | ICD-10-CM | POA: Insufficient documentation

## 2015-06-08 DIAGNOSIS — M25642 Stiffness of left hand, not elsewhere classified: Secondary | ICD-10-CM

## 2015-06-08 DIAGNOSIS — R29898 Other symptoms and signs involving the musculoskeletal system: Secondary | ICD-10-CM

## 2015-06-08 NOTE — Patient Instructions (Signed)
PROM: Wrist Flexion / Extension   Grasp  hand and slowly bend wrist until stretch is felt. Relax. Then stretch as far as possible in opposite direction. Be sure to keep elbow bent.  Hold __10__ sec. each way Repeat _10___ times per set.    Do _3-4___ sessions per day.     1. Grip Strengthening (Resistive Putty)   Squeeze putty using thumb and all fingers. Repeat 15 times. Do 2-3 sessions per day.   Extension (Assistive Putty)   Roll putty back and forth, being sure to use all fingertips. Repeat 3 times. Do 2-3 sessions per day.  Then pinch as below.   Palmar Pinch Strengthening (Resistive Putty)   Pinch putty between thumb and each fingertip in turn after rolling out
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
## 2015-06-14 ENCOUNTER — Ambulatory Visit: Payer: No Typology Code available for payment source | Admitting: Occupational Therapy

## 2015-06-14 DIAGNOSIS — R208 Other disturbances of skin sensation: Secondary | ICD-10-CM

## 2015-06-14 DIAGNOSIS — R29898 Other symptoms and signs involving the musculoskeletal system: Secondary | ICD-10-CM

## 2015-06-14 DIAGNOSIS — M25632 Stiffness of left wrist, not elsewhere classified: Secondary | ICD-10-CM

## 2015-06-14 DIAGNOSIS — M25642 Stiffness of left hand, not elsewhere classified: Secondary | ICD-10-CM

## 2015-06-14 DIAGNOSIS — M79642 Pain in left hand: Secondary | ICD-10-CM
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
## 2015-06-17 ENCOUNTER — Ambulatory Visit: Payer: No Typology Code available for payment source | Admitting: Occupational Therapy

## 2015-06-17 DIAGNOSIS — R29898 Other symptoms and signs involving the musculoskeletal system: Secondary | ICD-10-CM

## 2015-06-17 DIAGNOSIS — M25632 Stiffness of left wrist, not elsewhere classified: Secondary | ICD-10-CM

## 2015-06-17 DIAGNOSIS — M79642 Pain in left hand: Secondary | ICD-10-CM

## 2015-06-17 DIAGNOSIS — R208 Other disturbances of skin sensation: Secondary | ICD-10-CM

## 2015-06-17 DIAGNOSIS — M25642 Stiffness of left hand, not elsewhere classified: Secondary | ICD-10-CM
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
## 2015-06-21 ENCOUNTER — Ambulatory Visit: Payer: No Typology Code available for payment source | Admitting: Occupational Therapy

## 2015-06-21 DIAGNOSIS — R208 Other disturbances of skin sensation: Secondary | ICD-10-CM

## 2015-06-21 DIAGNOSIS — M79642 Pain in left hand: Secondary | ICD-10-CM

## 2015-06-21 DIAGNOSIS — M25642 Stiffness of left hand, not elsewhere classified: Secondary | ICD-10-CM

## 2015-06-21 DIAGNOSIS — R29898 Other symptoms and signs involving the musculoskeletal system: Secondary | ICD-10-CM

## 2015-06-21 DIAGNOSIS — M25632 Stiffness of left wrist, not elsewhere classified: Secondary | ICD-10-CM
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
## 2015-06-21 NOTE — Patient Instructions (Signed)
Wrist Flexion: Resisted   With right palm up, 1 pound weight in hand, bend wrist up. Return slowly. Repeat 10times per set.  Do 2 sessions per day.  Wrist Extension: Resisted   With right palm down, 1 pound weight in hand, bend wrist up. Return slowly. Repeat 10 times per set. Do 2 sessions per day.

## 2015-06-24 ENCOUNTER — Ambulatory Visit: Payer: No Typology Code available for payment source | Admitting: Occupational Therapy

## 2015-06-24 DIAGNOSIS — M25642 Stiffness of left hand, not elsewhere classified: Secondary | ICD-10-CM

## 2015-06-24 DIAGNOSIS — M79642 Pain in left hand: Secondary | ICD-10-CM

## 2015-06-24 DIAGNOSIS — M25632 Stiffness of left wrist, not elsewhere classified: Secondary | ICD-10-CM

## 2015-06-24 DIAGNOSIS — R29898 Other symptoms and signs involving the musculoskeletal system: Secondary | ICD-10-CM
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
## 2015-06-28 ENCOUNTER — Ambulatory Visit: Payer: No Typology Code available for payment source | Attending: Orthopedic Surgery | Admitting: Occupational Therapy

## 2015-06-28 DIAGNOSIS — M6281 Muscle weakness (generalized): Secondary | ICD-10-CM

## 2015-06-28 DIAGNOSIS — M25532 Pain in left wrist: Secondary | ICD-10-CM | POA: Insufficient documentation

## 2015-06-28 DIAGNOSIS — M25632 Stiffness of left wrist, not elsewhere classified: Secondary | ICD-10-CM | POA: Insufficient documentation
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
## 2015-07-01 ENCOUNTER — Ambulatory Visit: Payer: No Typology Code available for payment source | Admitting: Occupational Therapy

## 2015-07-01 DIAGNOSIS — M25532 Pain in left wrist: Secondary | ICD-10-CM

## 2015-07-01 DIAGNOSIS — M6281 Muscle weakness (generalized): Secondary | ICD-10-CM

## 2015-07-01 DIAGNOSIS — M25632 Stiffness of left wrist, not elsewhere classified: Secondary | ICD-10-CM
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
## 2015-07-05 ENCOUNTER — Ambulatory Visit: Payer: No Typology Code available for payment source | Admitting: Occupational Therapy

## 2015-07-05 DIAGNOSIS — M25532 Pain in left wrist: Secondary | ICD-10-CM

## 2015-07-05 DIAGNOSIS — M25632 Stiffness of left wrist, not elsewhere classified: Secondary | ICD-10-CM

## 2015-07-05 DIAGNOSIS — M6281 Muscle weakness (generalized): Secondary | ICD-10-CM
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
## 2015-07-08 ENCOUNTER — Ambulatory Visit: Payer: No Typology Code available for payment source | Admitting: Occupational Therapy

## 2015-07-08 DIAGNOSIS — M6281 Muscle weakness (generalized): Secondary | ICD-10-CM

## 2015-07-08 DIAGNOSIS — M25532 Pain in left wrist: Secondary | ICD-10-CM

## 2015-07-08 DIAGNOSIS — M25632 Stiffness of left wrist, not elsewhere classified: Secondary | ICD-10-CM
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
## 2015-07-08 NOTE — Addendum Note (Signed)
Addended by: Willa FraterFREEMAN, Nicolet Griffy D on: 07/08/2015 01:16 PM   Modules accepted: Orders

## 2015-07-14 ENCOUNTER — Ambulatory Visit: Payer: No Typology Code available for payment source | Admitting: Occupational Therapy

## 2015-07-14 DIAGNOSIS — M25532 Pain in left wrist: Secondary | ICD-10-CM

## 2015-07-14 DIAGNOSIS — M6281 Muscle weakness (generalized): Secondary | ICD-10-CM

## 2015-07-14 DIAGNOSIS — M25632 Stiffness of left wrist, not elsewhere classified: Secondary | ICD-10-CM
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
## 2015-07-16 ENCOUNTER — Ambulatory Visit: Payer: No Typology Code available for payment source | Admitting: Occupational Therapy

## 2015-07-16 DIAGNOSIS — M25632 Stiffness of left wrist, not elsewhere classified: Secondary | ICD-10-CM

## 2015-07-16 DIAGNOSIS — M6281 Muscle weakness (generalized): Secondary | ICD-10-CM

## 2015-07-16 DIAGNOSIS — M25532 Pain in left wrist: Secondary | ICD-10-CM
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
## 2015-07-19 ENCOUNTER — Encounter: Payer: Self-pay | Admitting: Occupational Therapy

## 2015-07-23 ENCOUNTER — Ambulatory Visit: Payer: No Typology Code available for payment source | Admitting: Occupational Therapy

## 2015-07-23 DIAGNOSIS — M25532 Pain in left wrist: Secondary | ICD-10-CM

## 2015-07-23 DIAGNOSIS — M25632 Stiffness of left wrist, not elsewhere classified: Secondary | ICD-10-CM

## 2015-07-23 DIAGNOSIS — M6281 Muscle weakness (generalized): Secondary | ICD-10-CM
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
## 2015-07-23 NOTE — Therapy (Deleted)
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 sensation, Increased edema, Decreased range of motion, Decreased coordination  Visit Diagnosis: Muscle weakness (generalized)  Stiffness of left wrist, not elsewhere classified  Pain in  left wrist    Problem List Patient Active Problem List   Diagnosis Date Noted  . Dog bite 05/02/2015  . Borderline personality disorder 07/06/2014  . MDD (major depressive disorder), recurrent severe, without psychosis (HCC) 07/06/2014  . Cannabis use disorder, severe, dependence (HCC) 07/06/2014  . Migraine headache 07/06/2014  . Generalized anxiety disorder 07/04/2014  . PTSD (post-traumatic stress disorder)   . Syncope 04/03/2011  . Headache(784.0) 04/03/2011    [name],[name] 07/23/2015, 1:29 PM [name], [name] [phone] Phone: [phone] 1:29 PM 07/23/2015 [facility] [address] [place], [place], [address] Phone: [phone]   Fax:  [phone]  Name: [name] MRN: [id number] Date of Birth: 1986/01/08

## 2015-12-15 ENCOUNTER — Encounter (HOSPITAL_COMMUNITY): Payer: Self-pay

## 2015-12-15 ENCOUNTER — Emergency Department (HOSPITAL_COMMUNITY)
Admission: EM | Admit: 2015-12-15 | Discharge: 2015-12-15 | Disposition: A | Discharge status: Home / Self Care | Payer: No Typology Code available for payment source | Attending: Emergency Medicine | Admitting: Emergency Medicine

## 2015-12-15 DIAGNOSIS — F840 Autistic disorder: Secondary | ICD-10-CM | POA: Insufficient documentation

## 2015-12-15 DIAGNOSIS — J45909 Unspecified asthma, uncomplicated: Secondary | ICD-10-CM | POA: Insufficient documentation

## 2015-12-15 DIAGNOSIS — F17210 Nicotine dependence, cigarettes, uncomplicated: Secondary | ICD-10-CM | POA: Insufficient documentation

## 2015-12-15 DIAGNOSIS — B373 Candidiasis of vulva and vagina: Secondary | ICD-10-CM | POA: Insufficient documentation

## 2015-12-15 DIAGNOSIS — B3731 Acute candidiasis of vulva and vagina: Secondary | ICD-10-CM

## 2015-12-15 LAB — WET PREP, GENITAL
CLUE CELLS WET PREP: NONE SEEN
SPERM: NONE SEEN
TRICH WET PREP: NONE SEEN
YEAST WET PREP: NONE SEEN

## 2015-12-15 LAB — URINE MICROSCOPIC-ADD ON

## 2015-12-15 LAB — URINALYSIS, ROUTINE W REFLEX MICROSCOPIC
BILIRUBIN URINE: NEGATIVE
GLUCOSE, UA: NEGATIVE mg/dL
HGB URINE DIPSTICK: NEGATIVE
Ketones, ur: NEGATIVE mg/dL
Nitrite: NEGATIVE
PH: 7 (ref 5.0–8.0)
Protein, ur: NEGATIVE mg/dL
SPECIFIC GRAVITY, URINE: 1.027 (ref 1.005–1.030)

## 2015-12-15 MED ORDER — FLUCONAZOLE 150 MG PO TABS: 150.0000 mg | ORAL_TABLET | ORAL | 0 refills | Status: AC

## 2015-12-15 MED ORDER — FLUCONAZOLE 100 MG PO TABS
150.0000 mg | ORAL_TABLET | Freq: Once | ORAL | Status: AC
  Administered 2015-12-15: 150 mg via ORAL
  Filled 2015-12-15: qty 2

## 2015-12-15 NOTE — ED Triage Notes (Signed)
Pt reports she noticed cottage cheese like discharge after having sex. She reports abstinence and had sex for the first time in 4 years and symptoms began after intercourse. Pt is concerned she could have yeast infection, hx of same.

## 2015-12-15 NOTE — ED Notes (Signed)
Guss Bundehaperoned Tyler, PA with pelvic examination and collection of vaginal specimens; vaginal samples were collected by Joselyn Glassmanyler, PA and sent to lab for testing
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
## 2015-12-15 NOTE — ED Notes (Signed)
Pt departed in NAD, refused use of wheelchair.  

## 2015-12-16 LAB — GC/CHLAMYDIA PROBE AMP (~~LOC~~) NOT AT ARMC
Chlamydia: NEGATIVE
NEISSERIA GONORRHEA: NEGATIVE

## 2015-12-16 LAB — HIV ANTIBODY (ROUTINE TESTING W REFLEX): HIV Screen 4th Generation wRfx: NONREACTIVE

## 2016-12-27 ENCOUNTER — Encounter (HOSPITAL_COMMUNITY): Payer: Self-pay | Admitting: *Deleted

## 2016-12-27 ENCOUNTER — Emergency Department (HOSPITAL_COMMUNITY)
Admission: EM | Admit: 2016-12-27 | Discharge: 2016-12-27 | Disposition: A | Discharge status: Home / Self Care | Payer: No Typology Code available for payment source | Attending: Emergency Medicine | Admitting: Emergency Medicine

## 2016-12-27 ENCOUNTER — Emergency Department (HOSPITAL_COMMUNITY): Payer: No Typology Code available for payment source

## 2016-12-27 DIAGNOSIS — Z79899 Other long term (current) drug therapy: Secondary | ICD-10-CM | POA: Diagnosis not present

## 2016-12-27 DIAGNOSIS — M62830 Muscle spasm of back: Secondary | ICD-10-CM | POA: Insufficient documentation

## 2016-12-27 DIAGNOSIS — Y9389 Activity, other specified: Secondary | ICD-10-CM | POA: Insufficient documentation

## 2016-12-27 DIAGNOSIS — F17210 Nicotine dependence, cigarettes, uncomplicated: Secondary | ICD-10-CM | POA: Diagnosis not present

## 2016-12-27 DIAGNOSIS — Y999 Unspecified external cause status: Secondary | ICD-10-CM | POA: Insufficient documentation

## 2016-12-27 DIAGNOSIS — S161XXA Strain of muscle, fascia and tendon at neck level, initial encounter: Secondary | ICD-10-CM | POA: Insufficient documentation

## 2016-12-27 DIAGNOSIS — Y929 Unspecified place or not applicable: Secondary | ICD-10-CM | POA: Diagnosis not present

## 2016-12-27 MED ORDER — CYCLOBENZAPRINE HCL 10 MG PO TABS
10.0000 mg | ORAL_TABLET | Freq: Once | ORAL | Status: AC
  Administered 2016-12-27: 10 mg via ORAL
  Filled 2016-12-27: qty 1

## 2016-12-27 MED ORDER — KETOROLAC TROMETHAMINE 60 MG/2ML IM SOLN
30.0000 mg | Freq: Once | INTRAMUSCULAR | Status: AC
  Administered 2016-12-27: 30 mg via INTRAMUSCULAR
  Filled 2016-12-27: qty 2

## 2016-12-27 MED ORDER — CYCLOBENZAPRINE HCL 10 MG PO TABS: 10.0000 mg | ORAL_TABLET | Freq: Two times a day (BID) | ORAL | 0 refills | Status: DC | PRN
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
## 2016-12-27 NOTE — Discharge Instructions (Signed)
Do not drive while taking the muscle relaxant as it will make you sleepy. Follow up with your health care provider, return here as needed. You can take tylenol in addition to the medication we give you.

## 2017-04-19 DIAGNOSIS — F411 Generalized anxiety disorder: Secondary | ICD-10-CM | POA: Diagnosis not present

## 2018-04-03 ENCOUNTER — Other Ambulatory Visit: Payer: Self-pay

## 2018-04-03 ENCOUNTER — Encounter (HOSPITAL_COMMUNITY): Payer: Self-pay | Admitting: Emergency Medicine

## 2018-04-03 ENCOUNTER — Ambulatory Visit (INDEPENDENT_AMBULATORY_CARE_PROVIDER_SITE_OTHER): Payer: Medicare Other

## 2018-04-03 ENCOUNTER — Ambulatory Visit (HOSPITAL_COMMUNITY)
Admission: EM | Admit: 2018-04-03 | Discharge: 2018-04-03 | Disposition: A | Discharge status: Home / Self Care | Payer: Medicare Other | Attending: Family Medicine | Admitting: Family Medicine

## 2018-04-03 DIAGNOSIS — S60921A Unspecified superficial injury of right hand, initial encounter: Secondary | ICD-10-CM

## 2018-04-03 DIAGNOSIS — S6991XA Unspecified injury of right wrist, hand and finger(s), initial encounter: Secondary | ICD-10-CM | POA: Diagnosis not present

## 2018-04-03 DIAGNOSIS — S62316A Displaced fracture of base of fifth metacarpal bone, right hand, initial encounter for closed fracture: Secondary | ICD-10-CM | POA: Diagnosis not present

## 2018-04-03 DIAGNOSIS — W2209XA Striking against other stationary object, initial encounter: Secondary | ICD-10-CM

## 2018-04-03 DIAGNOSIS — S62339A Displaced fracture of neck of unspecified metacarpal bone, initial encounter for closed fracture: Secondary | ICD-10-CM

## 2018-04-03 DIAGNOSIS — S62306A Unspecified fracture of fifth metacarpal bone, right hand, initial encounter for closed fracture: Secondary | ICD-10-CM | POA: Diagnosis not present

## 2018-04-03 MED ORDER — HYDROCODONE-ACETAMINOPHEN 5-325 MG PO TABS: 1.0000 | ORAL_TABLET | Freq: Four times a day (QID) | ORAL | 0 refills | Status: DC | PRN

## 2018-04-03 MED ORDER — HYDROCODONE-ACETAMINOPHEN 5-325 MG PO TABS
ORAL_TABLET | ORAL | Status: AC
  Filled 2018-04-03: qty 1

## 2018-04-03 MED ORDER — HYDROCODONE-ACETAMINOPHEN 5-325 MG PO TABS
1.0000 | ORAL_TABLET | Freq: Once | ORAL | Status: AC
  Administered 2018-04-03: 1 via ORAL

## 2018-04-03 NOTE — Discharge Instructions (Addendum)
Please rest, ice and elevate the affected extremity. If not allergic, you may take Tylenol 1000mg  every 6 hours or as needed for discomfort. Follow up with the hand surgeon within one week for further evaluation. Please call for any appointment. Do not remove your splint. You may use a garbage bag while showering to keep your splint dry. Please return here if you are experiencing increased pain, tingling/numbness, swelling, redness, or fever.
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
## 2018-04-03 NOTE — ED Notes (Signed)
Patient has an ice pack to painful area

## 2018-04-03 NOTE — Progress Notes (Signed)
Orthopedic Tech Progress Note Patient Details:  [name] [birth year] [id number] APPLIED WITH dAYE Ortho Devices Type of Ortho Device: Ulna gutter splint, Arm sling Ortho Device/Splint Location: RUE Ortho Device/Splint Interventions: Adjustment, Application, Ordered   Post Interventions Patient Tolerated: Well Instructions Provided: Care of device, Adjustment of device   Tawni Carnes [facility] 04/03/2018, 3:42 PM
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
## 2018-04-05 DIAGNOSIS — Z114 Encounter for screening for human immunodeficiency virus [HIV]: Secondary | ICD-10-CM | POA: Diagnosis not present

## 2018-04-05 DIAGNOSIS — Z113 Encounter for screening for infections with a predominantly sexual mode of transmission: Secondary | ICD-10-CM | POA: Diagnosis not present

## 2018-04-09 DIAGNOSIS — S62346A Nondisplaced fracture of base of fifth metacarpal bone, right hand, initial encounter for closed fracture: Secondary | ICD-10-CM | POA: Diagnosis not present
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
## 2018-04-12 ENCOUNTER — Other Ambulatory Visit: Payer: Self-pay

## 2018-04-12 ENCOUNTER — Encounter (HOSPITAL_COMMUNITY): Payer: Self-pay | Admitting: *Deleted
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
## 2018-04-15 ENCOUNTER — Encounter (HOSPITAL_COMMUNITY)
Admission: RE | Disposition: A | Discharge status: Home / Self Care | Payer: Self-pay | Source: Home / Self Care | Attending: Orthopedic Surgery

## 2018-04-15 ENCOUNTER — Ambulatory Visit (HOSPITAL_COMMUNITY): Payer: Medicare Other | Admitting: Anesthesiology

## 2018-04-15 ENCOUNTER — Ambulatory Visit (HOSPITAL_COMMUNITY)
Admission: RE | Admit: 2018-04-15 | Discharge: 2018-04-15 | Disposition: A | Discharge status: Home / Self Care | Payer: Medicare Other | Attending: Orthopedic Surgery | Admitting: Orthopedic Surgery

## 2018-04-15 ENCOUNTER — Encounter (HOSPITAL_COMMUNITY): Payer: Self-pay | Admitting: *Deleted

## 2018-04-15 DIAGNOSIS — F17210 Nicotine dependence, cigarettes, uncomplicated: Secondary | ICD-10-CM | POA: Diagnosis not present

## 2018-04-15 DIAGNOSIS — J45909 Unspecified asthma, uncomplicated: Secondary | ICD-10-CM | POA: Diagnosis not present

## 2018-04-15 DIAGNOSIS — F4310 Post-traumatic stress disorder, unspecified: Secondary | ICD-10-CM | POA: Diagnosis not present

## 2018-04-15 DIAGNOSIS — W2209XA Striking against other stationary object, initial encounter: Secondary | ICD-10-CM | POA: Insufficient documentation

## 2018-04-15 DIAGNOSIS — F845 Asperger's syndrome: Secondary | ICD-10-CM | POA: Diagnosis not present

## 2018-04-15 DIAGNOSIS — Z8711 Personal history of peptic ulcer disease: Secondary | ICD-10-CM | POA: Insufficient documentation

## 2018-04-15 DIAGNOSIS — S62336A Displaced fracture of neck of fifth metacarpal bone, right hand, initial encounter for closed fracture: Secondary | ICD-10-CM | POA: Insufficient documentation

## 2018-04-15 DIAGNOSIS — Z886 Allergy status to analgesic agent status: Secondary | ICD-10-CM | POA: Insufficient documentation

## 2018-04-15 DIAGNOSIS — S62306A Unspecified fracture of fifth metacarpal bone, right hand, initial encounter for closed fracture: Secondary | ICD-10-CM

## 2018-04-15 DIAGNOSIS — S62336D Displaced fracture of neck of fifth metacarpal bone, right hand, subsequent encounter for fracture with routine healing: Secondary | ICD-10-CM | POA: Diagnosis not present

## 2018-04-15 DIAGNOSIS — Z79899 Other long term (current) drug therapy: Secondary | ICD-10-CM | POA: Diagnosis not present

## 2018-04-15 HISTORY — DX: Pneumonia, unspecified organism: J18.9

## 2018-04-15 HISTORY — PX: OPEN REDUCTION INTERNAL FIXATION (ORIF) METACARPAL: SHX6234

## 2018-04-15 LAB — POCT PREGNANCY, URINE: Preg Test, Ur: NEGATIVE

## 2018-04-15 LAB — CBC
HEMATOCRIT: 40.3 % (ref 36.0–46.0)
Hemoglobin: 12.3 g/dL (ref 12.0–15.0)
MCH: 27.4 pg (ref 26.0–34.0)
MCHC: 30.5 g/dL (ref 30.0–36.0)
MCV: 89.8 fL (ref 80.0–100.0)
Platelets: 303 10*3/uL (ref 150–400)
RBC: 4.49 MIL/uL (ref 3.87–5.11)
RDW: 14.2 % (ref 11.5–15.5)
WBC: 4 10*3/uL (ref 4.0–10.5)
nRBC: 0 % (ref 0.0–0.2)

## 2018-04-15 SURGERY — OPEN REDUCTION INTERNAL FIXATION (ORIF) METACARPAL
Anesthesia: General | Laterality: Right

## 2018-04-15 MED ORDER — LACTATED RINGERS IV SOLN
INTRAVENOUS | Status: DC | PRN
  Administered 2018-04-15: 12:00:00 via INTRAVENOUS

## 2018-04-15 MED ORDER — DEXMEDETOMIDINE HCL 200 MCG/2ML IV SOLN
INTRAVENOUS | Status: DC | PRN
  Administered 2018-04-15: 8 ug via INTRAVENOUS
  Administered 2018-04-15 (×2): 4 ug via INTRAVENOUS

## 2018-04-15 MED ORDER — CLINDAMYCIN PHOSPHATE 900 MG/50ML IV SOLN
INTRAVENOUS | Status: DC | PRN
  Administered 2018-04-15: 900 mg via INTRAVENOUS

## 2018-04-15 MED ORDER — MIDAZOLAM HCL 2 MG/2ML IJ SOLN
INTRAMUSCULAR | Status: AC
  Filled 2018-04-15: qty 2

## 2018-04-15 MED ORDER — MIDAZOLAM HCL 5 MG/5ML IJ SOLN
INTRAMUSCULAR | Status: DC | PRN
  Administered 2018-04-15: 2 mg via INTRAVENOUS

## 2018-04-15 MED ORDER — LIDOCAINE 2% (20 MG/ML) 5 ML SYRINGE
INTRAMUSCULAR | Status: AC
  Filled 2018-04-15: qty 5

## 2018-04-15 MED ORDER — LIDOCAINE 2% (20 MG/ML) 5 ML SYRINGE
INTRAMUSCULAR | Status: DC | PRN
  Administered 2018-04-15: 40 mg via INTRAVENOUS

## 2018-04-15 MED ORDER — EPHEDRINE 5 MG/ML INJ
INTRAVENOUS | Status: AC
  Filled 2018-04-15: qty 10

## 2018-04-15 MED ORDER — FENTANYL CITRATE (PF) 100 MCG/2ML IJ SOLN
INTRAMUSCULAR | Status: AC
  Filled 2018-04-15: qty 4

## 2018-04-15 MED ORDER — FENTANYL CITRATE (PF) 250 MCG/5ML IJ SOLN
INTRAMUSCULAR | Status: AC
  Filled 2018-04-15: qty 5

## 2018-04-15 MED ORDER — SODIUM CHLORIDE 0.9 % IR SOLN
Status: DC | PRN
  Administered 2018-04-15: 1000 mL

## 2018-04-15 MED ORDER — ONDANSETRON HCL 4 MG/2ML IJ SOLN: 4.0000 mg | Freq: Once | INTRAMUSCULAR | Status: DC | PRN

## 2018-04-15 MED ORDER — DEXAMETHASONE SODIUM PHOSPHATE 10 MG/ML IJ SOLN
INTRAMUSCULAR | Status: DC | PRN
  Administered 2018-04-15: 10 mg via INTRAVENOUS

## 2018-04-15 MED ORDER — ONDANSETRON HCL 4 MG/2ML IJ SOLN
INTRAMUSCULAR | Status: AC
  Filled 2018-04-15: qty 2

## 2018-04-15 MED ORDER — DEXAMETHASONE SODIUM PHOSPHATE 10 MG/ML IJ SOLN
INTRAMUSCULAR | Status: AC
  Filled 2018-04-15: qty 1

## 2018-04-15 MED ORDER — FENTANYL CITRATE (PF) 100 MCG/2ML IJ SOLN
25.0000 ug | INTRAMUSCULAR | Status: DC | PRN
  Administered 2018-04-15: 50 ug via INTRAVENOUS

## 2018-04-15 MED ORDER — CLINDAMYCIN PHOSPHATE 900 MG/50ML IV SOLN
INTRAVENOUS | Status: AC
  Filled 2018-04-15: qty 50

## 2018-04-15 MED ORDER — EPHEDRINE SULFATE 50 MG/ML IJ SOLN
INTRAMUSCULAR | Status: DC | PRN
  Administered 2018-04-15: 10 mg via INTRAVENOUS
  Administered 2018-04-15: 5 mg via INTRAVENOUS

## 2018-04-15 MED ORDER — PROPOFOL 10 MG/ML IV BOLUS
INTRAVENOUS | Status: DC | PRN
  Administered 2018-04-15: 200 mg via INTRAVENOUS
  Administered 2018-04-15 (×2): 50 mg via INTRAVENOUS

## 2018-04-15 MED ORDER — PROPOFOL 10 MG/ML IV BOLUS
INTRAVENOUS | Status: AC
  Filled 2018-04-15: qty 20

## 2018-04-15 MED ORDER — FENTANYL CITRATE (PF) 100 MCG/2ML IJ SOLN
INTRAMUSCULAR | Status: DC | PRN
  Administered 2018-04-15: 25 ug via INTRAVENOUS

## 2018-04-15 MED ORDER — PHENYLEPHRINE 40 MCG/ML (10ML) SYRINGE FOR IV PUSH (FOR BLOOD PRESSURE SUPPORT)
PREFILLED_SYRINGE | INTRAVENOUS | Status: AC
  Filled 2018-04-15: qty 10

## 2018-04-15 MED ORDER — BUPIVACAINE HCL (PF) 0.25 % IJ SOLN
INTRAMUSCULAR | Status: AC
  Filled 2018-04-15: qty 30

## 2018-04-15 MED ORDER — ONDANSETRON HCL 4 MG/2ML IJ SOLN
INTRAMUSCULAR | Status: DC | PRN
  Administered 2018-04-15: 4 mg via INTRAVENOUS

## 2018-04-15 MED ORDER — MIDAZOLAM HCL 2 MG/2ML IJ SOLN
0.5000 mg | Freq: Once | INTRAMUSCULAR | Status: AC
  Administered 2018-04-15: 1 mg via INTRAVENOUS

## 2018-04-15 MED ORDER — BUPIVACAINE HCL (PF) 0.25 % IJ SOLN
INTRAMUSCULAR | Status: DC | PRN
  Administered 2018-04-15: 8 mL

## 2018-04-15 MED ORDER — PHENYLEPHRINE 40 MCG/ML (10ML) SYRINGE FOR IV PUSH (FOR BLOOD PRESSURE SUPPORT)
PREFILLED_SYRINGE | INTRAVENOUS | Status: DC | PRN
  Administered 2018-04-15: 120 ug via INTRAVENOUS
  Administered 2018-04-15 (×2): 80 ug via INTRAVENOUS

## 2018-04-15 SURGICAL SUPPLY — 41 items
BANDAGE ELASTIC 3 VELCRO ST LF (GAUZE/BANDAGES/DRESSINGS) ×2 IMPLANT
BANDAGE ELASTIC 4 VELCRO ST LF (GAUZE/BANDAGES/DRESSINGS) ×2 IMPLANT
BLADE CLIPPER SURG (BLADE) IMPLANT
BNDG ESMARK 4X9 LF (GAUZE/BANDAGES/DRESSINGS) ×3 IMPLANT
BNDG GAUZE ELAST 4 BULKY (GAUZE/BANDAGES/DRESSINGS) ×3 IMPLANT
BNDG STRETCH 4X75 NS LF (GAUZE/BANDAGES/DRESSINGS) ×2 IMPLANT
CLOSURE WOUND 1/2 X4 (GAUZE/BANDAGES/DRESSINGS)
COVER SURGICAL LIGHT HANDLE (MISCELLANEOUS) ×3 IMPLANT
CUFF TOURNIQUET SINGLE 18IN (TOURNIQUET CUFF) ×3 IMPLANT
DRAPE OEC MINIVIEW 54X84 (DRAPES) ×3 IMPLANT
DRAPE SURG 17X11 SM STRL (DRAPES) ×3 IMPLANT
GAUZE SPONGE 4X4 12PLY STRL LF (GAUZE/BANDAGES/DRESSINGS) ×2 IMPLANT
GAUZE XEROFORM 1X8 LF (GAUZE/BANDAGES/DRESSINGS) ×2 IMPLANT
GLOVE BIOGEL PI IND STRL 8.5 (GLOVE) ×1 IMPLANT
GLOVE BIOGEL PI INDICATOR 8.5 (GLOVE) ×2
GLOVE SURG ORTHO 8.0 STRL STRW (GLOVE) ×3 IMPLANT
GOWN STRL REUS W/ TWL LRG LVL3 (GOWN DISPOSABLE) ×3 IMPLANT
GOWN STRL REUS W/ TWL XL LVL3 (GOWN DISPOSABLE) ×1 IMPLANT
GOWN STRL REUS W/TWL LRG LVL3 (GOWN DISPOSABLE) ×6
GOWN STRL REUS W/TWL XL LVL3 (GOWN DISPOSABLE) ×2
KIT BASIN OR (CUSTOM PROCEDURE TRAY) ×3 IMPLANT
KIT TURNOVER KIT B (KITS) ×3 IMPLANT
MANIFOLD NEPTUNE II (INSTRUMENTS) ×3 IMPLANT
NDL HYPO 25X1 1.5 SAFETY (NEEDLE) ×1 IMPLANT
NEEDLE HYPO 25X1 1.5 SAFETY (NEEDLE) ×3 IMPLANT
NS IRRIG 1000ML POUR BTL (IV SOLUTION) ×3 IMPLANT
PACK ORTHO EXTREMITY (CUSTOM PROCEDURE TRAY) ×3 IMPLANT
PAD ARMBOARD 7.5X6 YLW CONV (MISCELLANEOUS) ×6 IMPLANT
PAD CAST 3X4 CTTN HI CHSV (CAST SUPPLIES) IMPLANT
PADDING CAST COTTON 3X4 STRL (CAST SUPPLIES) ×2
SOAP 2 % CHG 4 OZ (WOUND CARE) ×3 IMPLANT
STRIP CLOSURE SKIN 1/2X4 (GAUZE/BANDAGES/DRESSINGS) IMPLANT
SUT ETHILON 4 0 PS 2 18 (SUTURE) IMPLANT
SUT MNCRL AB 4-0 PS2 18 (SUTURE) IMPLANT
SUT VIC AB 2-0 FS1 27 (SUTURE) IMPLANT
SUT VICRYL 4-0 PS2 18IN ABS (SUTURE) IMPLANT
SYR CONTROL 10ML LL (SYRINGE) IMPLANT
TUBE CONNECTING 12'X1/4 (SUCTIONS) ×1
TUBE CONNECTING 12X1/4 (SUCTIONS) ×2 IMPLANT
YANKAUER SUCT BULB TIP NO VENT (SUCTIONS) IMPLANT
k wire ×4 IMPLANT

## 2018-04-15 NOTE — Anesthesia Preprocedure Evaluation (Signed)
Anesthesia Evaluation  Patient identified by MRN, date of birth, ID band Patient awake    Reviewed: Allergy & Precautions, NPO status , Patient's Chart, lab work & pertinent test results  Airway Mallampati: II  TM Distance: >3 FB Neck ROM: Full    Dental  (+) Teeth Intact   Pulmonary Current Smoker,    breath sounds clear to auscultation       Cardiovascular  Rhythm:Regular Rate:Normal     Neuro/Psych    GI/Hepatic   Endo/Other    Renal/GU      Musculoskeletal   Abdominal   Peds  Hematology   Anesthesia Other Findings   Reproductive/Obstetrics                             Anesthesia Physical Anesthesia Plan  ASA: II  Anesthesia Plan: General   Post-op Pain Management:  Regional for Post-op pain   Induction: Intravenous  PONV Risk Score and Plan: Ondansetron and Dexamethasone  Airway Management Planned: LMA  Additional Equipment:   Intra-op Plan:   Post-operative Plan:   Informed Consent: I have reviewed the patients History and Physical, chart, labs and discussed the procedure including the risks, benefits and alternatives for the proposed anesthesia with the patient or authorized representative who has indicated his/her understanding and acceptance.     Dental advisory given  Plan Discussed with: CRNA and Anesthesiologist  Anesthesia Plan Comments:         Anesthesia Quick Evaluation
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
## 2018-04-15 NOTE — Anesthesia Procedure Notes (Signed)
Procedure Name: LMA Insertion °Performed by: Tecora Eustache H, CRNA °Pre-anesthesia Checklist: Patient identified, Emergency Drugs available, Suction available and Patient being monitored °Patient Re-evaluated:Patient Re-evaluated prior to induction °Oxygen Delivery Method: Circle System Utilized °Preoxygenation: Pre-oxygenation with 100% oxygen °Induction Type: IV induction °Ventilation: Mask ventilation without difficulty °LMA: LMA inserted °LMA Size: 4.0 °Number of attempts: 1 °Airway Equipment and Method: Bite block °Placement Confirmation: positive ETCO2 °Tube secured with: Tape °Dental Injury: Teeth and Oropharynx as per pre-operative assessment  ° ° ° ° ° ° °

## 2018-04-15 NOTE — Transfer of Care (Signed)
Immediate Anesthesia Transfer of Care Note  Patient: [name]  Procedure(s) Performed: RIGHT SMALL FINGER CLOSED MANIPULATION AND PINNING, POSSIBLE OPEN REDUCTION INTERNAL FIXATION (ORIF) METACARPAL (Right )  Patient Location: PACU  Anesthesia Type:General  Level of Consciousness: awake and confused  Patient slightly distressed and disoriented  Airway & Oxygen Therapy: Patient Spontanous Breathing and Patient connected to face mask oxygen  Post-op Assessment: Report given to RN and Post -op Vital signs reviewed and stable    Post vital signs: Reviewed and stable  Last Vitals:  Vitals Value Taken Time  BP 132/91 04/15/2018  1:08 PM  Temp    Pulse 82 04/15/2018  1:10 PM  Resp 24 04/15/2018  1:10 PM  SpO2 100 % 04/15/2018  1:10 PM  Vitals shown include unvalidated device data.  Last Pain:  Vitals:   04/15/18 1142  TempSrc:   PainSc: 9       Patients Stated Pain Goal: 2 (04/15/18 1142)  Complications: No apparent anesthesia complications

## 2018-04-15 NOTE — Discharge Instructions (Signed)
KEEP BANDAGE CLEAN AND DRY CALL OFFICE FOR F/U APPT [phone] with PA Laurence Compton KEEP HAND ELEVATED ABOVE HEART OK TO APPLY ICE TO OPERATIVE AREA CONTACT OFFICE IF ANY WORSENING PAIN OR CONCERNS.

## 2018-04-15 NOTE — Anesthesia Postprocedure Evaluation (Signed)
Anesthesia Post Note  Patient: Sarita BottomJessica L Wyble  Procedure(s) Performed: RIGHT SMALL FINGER CLOSED MANIPULATION AND PINNING, POSSIBLE OPEN REDUCTION INTERNAL FIXATION (ORIF) METACARPAL (Right )     Patient location during evaluation: PACU Anesthesia Type: General Level of consciousness: awake and alert Pain management: pain level controlled Vital Signs Assessment: post-procedure vital signs reviewed and stable Respiratory status: spontaneous breathing, nonlabored ventilation, respiratory function stable and patient connected to nasal cannula oxygen Cardiovascular status: blood pressure returned to baseline and stable Postop Assessment: no apparent nausea or vomiting Anesthetic complications: no    Last Vitals:  Vitals:   04/15/18 1345 04/15/18 1400  BP:  (!) 154/93  Pulse: 61 61  Resp: 18 20  Temp:    SpO2: 100% 100%    Last Pain:  Vitals:   04/15/18 1142  TempSrc:   PainSc: 9                  Abdulahad Mederos COKER

## 2018-04-17 ENCOUNTER — Encounter (HOSPITAL_COMMUNITY): Payer: Self-pay | Admitting: Orthopedic Surgery

## 2018-04-22 DIAGNOSIS — F4310 Post-traumatic stress disorder, unspecified: Secondary | ICD-10-CM | POA: Diagnosis not present

## 2018-04-22 DIAGNOSIS — F411 Generalized anxiety disorder: Secondary | ICD-10-CM | POA: Diagnosis not present

## 2018-04-22 DIAGNOSIS — Z13220 Encounter for screening for lipoid disorders: Secondary | ICD-10-CM | POA: Diagnosis not present

## 2018-04-22 DIAGNOSIS — F329 Major depressive disorder, single episode, unspecified: Secondary | ICD-10-CM | POA: Diagnosis not present

## 2018-04-22 DIAGNOSIS — Z130 Encounter for screening for diseases of the blood and blood-forming organs and certain disorders involving the immune mechanism: Secondary | ICD-10-CM | POA: Diagnosis not present

## 2018-04-22 DIAGNOSIS — Z0189 Encounter for other specified special examinations: Secondary | ICD-10-CM | POA: Diagnosis not present

## 2018-04-22 DIAGNOSIS — Z131 Encounter for screening for diabetes mellitus: Secondary | ICD-10-CM | POA: Diagnosis not present

## 2018-04-26 DIAGNOSIS — S62366D Nondisplaced fracture of neck of fifth metacarpal bone, right hand, subsequent encounter for fracture with routine healing: Secondary | ICD-10-CM | POA: Diagnosis not present

## 2018-04-26 DIAGNOSIS — Z5189 Encounter for other specified aftercare: Secondary | ICD-10-CM | POA: Diagnosis not present

## 2018-05-14 DIAGNOSIS — S62366D Nondisplaced fracture of neck of fifth metacarpal bone, right hand, subsequent encounter for fracture with routine healing: Secondary | ICD-10-CM | POA: Diagnosis not present

## 2018-05-14 DIAGNOSIS — Z5189 Encounter for other specified aftercare: Secondary | ICD-10-CM | POA: Diagnosis not present

## 2018-05-16 DIAGNOSIS — M79641 Pain in right hand: Secondary | ICD-10-CM | POA: Diagnosis not present

## 2018-05-22 DIAGNOSIS — F411 Generalized anxiety disorder: Secondary | ICD-10-CM | POA: Diagnosis not present

## 2018-05-22 DIAGNOSIS — F329 Major depressive disorder, single episode, unspecified: Secondary | ICD-10-CM | POA: Diagnosis not present

## 2018-05-22 DIAGNOSIS — Z3202 Encounter for pregnancy test, result negative: Secondary | ICD-10-CM | POA: Diagnosis not present

## 2018-05-22 DIAGNOSIS — I10 Essential (primary) hypertension: Secondary | ICD-10-CM | POA: Diagnosis not present

## 2018-05-22 DIAGNOSIS — F4310 Post-traumatic stress disorder, unspecified: Secondary | ICD-10-CM | POA: Diagnosis not present

## 2018-05-22 DIAGNOSIS — Z0000 Encounter for general adult medical examination without abnormal findings: Secondary | ICD-10-CM | POA: Diagnosis not present

## 2018-05-24 DIAGNOSIS — M79641 Pain in right hand: Secondary | ICD-10-CM | POA: Diagnosis not present

## 2018-05-29 DIAGNOSIS — M79641 Pain in right hand: Secondary | ICD-10-CM | POA: Diagnosis not present

## 2018-06-03 DIAGNOSIS — M79641 Pain in right hand: Secondary | ICD-10-CM | POA: Diagnosis not present

## 2018-06-07 DIAGNOSIS — M79641 Pain in right hand: Secondary | ICD-10-CM | POA: Diagnosis not present

## 2018-06-10 DIAGNOSIS — M79641 Pain in right hand: Secondary | ICD-10-CM | POA: Diagnosis not present

## 2018-06-11 DIAGNOSIS — S62336D Displaced fracture of neck of fifth metacarpal bone, right hand, subsequent encounter for fracture with routine healing: Secondary | ICD-10-CM | POA: Diagnosis not present

## 2018-06-11 DIAGNOSIS — Z5189 Encounter for other specified aftercare: Secondary | ICD-10-CM | POA: Diagnosis not present

## 2018-06-17 DIAGNOSIS — M79641 Pain in right hand: Secondary | ICD-10-CM | POA: Diagnosis not present

## 2018-06-24 DIAGNOSIS — M79641 Pain in right hand: Secondary | ICD-10-CM | POA: Diagnosis not present

## 2018-07-01 DIAGNOSIS — M79641 Pain in right hand: Secondary | ICD-10-CM | POA: Diagnosis not present

## 2018-07-09 DIAGNOSIS — M79641 Pain in right hand: Secondary | ICD-10-CM | POA: Diagnosis not present

## 2018-07-11 DIAGNOSIS — Z4789 Encounter for other orthopedic aftercare: Secondary | ICD-10-CM | POA: Diagnosis not present

## 2018-07-11 DIAGNOSIS — S62336D Displaced fracture of neck of fifth metacarpal bone, right hand, subsequent encounter for fracture with routine healing: Secondary | ICD-10-CM | POA: Diagnosis not present

## 2019-01-24 ENCOUNTER — Encounter (HOSPITAL_COMMUNITY): Payer: Self-pay

## 2019-01-24 ENCOUNTER — Other Ambulatory Visit: Payer: Self-pay

## 2019-01-24 ENCOUNTER — Telehealth (HOSPITAL_COMMUNITY): Payer: Self-pay | Admitting: Emergency Medicine

## 2019-01-24 ENCOUNTER — Ambulatory Visit (HOSPITAL_COMMUNITY)
Admission: EM | Admit: 2019-01-24 | Discharge: 2019-01-24 | Disposition: A | Discharge status: Home / Self Care | Payer: Medicare Other | Attending: Emergency Medicine | Admitting: Emergency Medicine

## 2019-01-24 DIAGNOSIS — R590 Localized enlarged lymph nodes: Secondary | ICD-10-CM | POA: Diagnosis not present

## 2019-01-24 LAB — CBC WITH DIFFERENTIAL/PLATELET
Abs Immature Granulocytes: 0.02 10*3/uL (ref 0.00–0.07)
Basophils Absolute: 0.1 10*3/uL (ref 0.0–0.1)
Basophils Relative: 1 %
Eosinophils Absolute: 0.2 10*3/uL (ref 0.0–0.5)
Eosinophils Relative: 4 %
HCT: 38.5 % (ref 36.0–46.0)
Hemoglobin: 12.2 g/dL (ref 12.0–15.0)
Immature Granulocytes: 0 %
Lymphocytes Relative: 33 %
Lymphs Abs: 1.6 10*3/uL (ref 0.7–4.0)
MCH: 27.8 pg (ref 26.0–34.0)
MCHC: 31.7 g/dL (ref 30.0–36.0)
MCV: 87.7 fL (ref 80.0–100.0)
Monocytes Absolute: 0.3 10*3/uL (ref 0.1–1.0)
Monocytes Relative: 6 %
Neutro Abs: 2.8 10*3/uL (ref 1.7–7.7)
Neutrophils Relative %: 56 %
Platelets: 283 10*3/uL (ref 150–400)
RBC: 4.39 MIL/uL (ref 3.87–5.11)
RDW: 14.1 % (ref 11.5–15.5)
WBC: 4.9 10*3/uL (ref 4.0–10.5)
nRBC: 0 % (ref 0.0–0.2)

## 2019-01-24 NOTE — ED Notes (Signed)
CBC order was d/c'd by Epic. Md put a new order in so, the blood could be processed. Pt should be billed only once for CBC order.
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
## 2019-01-24 NOTE — Telephone Encounter (Signed)
CBC with differential ordered earlier was canceled by accident.  Reordering it-please process as stat.

## 2019-01-24 NOTE — ED Triage Notes (Signed)
Pt states 3 days ago she started noticing small bumps in her neck. Pt states she was thinking she is having the bumps because she twisted her hair.

## 2019-01-24 NOTE — Discharge Instructions (Addendum)
Make sure that you send it for my chart before you leave so that I have a way to contact you.  You will also be able to get your lab results.  I will call you or contact you through MyChart if your lab result is abnormal and we need to do something about it.  Otherwise, give this 3 or 4 weeks.  Continue warm compresses, 1 g of Tylenol 3 or 4 times a day as needed for pain.

## 2019-01-31 NOTE — Telephone Encounter (Signed)
This was resulted on 10/30

## 2019-03-14 ENCOUNTER — Other Ambulatory Visit: Payer: Self-pay | Admitting: Cardiology

## 2019-03-14 DIAGNOSIS — Z20822 Contact with and (suspected) exposure to covid-19: Secondary | ICD-10-CM

## 2019-03-15 LAB — NOVEL CORONAVIRUS, NAA: SARS-CoV-2, NAA: DETECTED — AB

## 2019-03-16 ENCOUNTER — Telehealth (HOSPITAL_COMMUNITY): Payer: Self-pay | Admitting: Critical Care Medicine

## 2019-03-16 NOTE — Telephone Encounter (Signed)
-----   Message from Sarah E Ellington, RN sent at 03/16/2019  6:54 AM EST -----  ----- Message ----- From: Interface, [facility] Lab Results In Sent: 03/15/2019  10:36 PM EST To: Mobile Screening Testing Result Pool   

## 2019-03-16 NOTE — Telephone Encounter (Signed)
I contacted this patient who is positive Covid from December 18.  She is asymptomatic at this time.  She knows to stay in isolation till December 29
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
